# Patient Record
Sex: Female | Born: 1965 | Race: White | Hispanic: No | Marital: Married | Smoking: Current some day smoker
Health system: Southern US, Community
[De-identification: ages and names within clinical notes are randomized; demographics above are authoritative.]

## PROBLEM LIST (undated history)

## (undated) DIAGNOSIS — F3181 Bipolar II disorder: Secondary | ICD-10-CM

## (undated) DIAGNOSIS — F32A Depression, unspecified: Secondary | ICD-10-CM

## (undated) DIAGNOSIS — K635 Polyp of colon: Secondary | ICD-10-CM

## (undated) DIAGNOSIS — I219 Acute myocardial infarction, unspecified: Secondary | ICD-10-CM

## (undated) DIAGNOSIS — F329 Major depressive disorder, single episode, unspecified: Secondary | ICD-10-CM

## (undated) DIAGNOSIS — E785 Hyperlipidemia, unspecified: Secondary | ICD-10-CM

## (undated) DIAGNOSIS — F419 Anxiety disorder, unspecified: Secondary | ICD-10-CM

## (undated) DIAGNOSIS — I10 Essential (primary) hypertension: Secondary | ICD-10-CM

## (undated) HISTORY — PX: OTHER SURGICAL HISTORY: SHX169

## (undated) HISTORY — PX: ABDOMINAL HYSTERECTOMY: SHX81

## (undated) HISTORY — DX: Acute myocardial infarction, unspecified: I21.9

## (undated) HISTORY — DX: Bipolar II disorder: F31.81

## (undated) HISTORY — DX: Anxiety disorder, unspecified: F41.9

## (undated) HISTORY — DX: Essential (primary) hypertension: I10

## (undated) HISTORY — PX: CARDIAC CATHETERIZATION: SHX172

## (undated) HISTORY — DX: Depression, unspecified: F32.A

## (undated) HISTORY — PX: HERNIA REPAIR: SHX51

## (undated) HISTORY — DX: Polyp of colon: K63.5

## (undated) HISTORY — DX: Hyperlipidemia, unspecified: E78.5

## (undated) HISTORY — PX: BREAST BIOPSY: SHX20

## (undated) HISTORY — DX: Major depressive disorder, single episode, unspecified: F32.9

---

## 1998-06-08 ENCOUNTER — Other Ambulatory Visit: Admission: RE | Admit: 1998-06-08 | Discharge: 1998-06-08 | Payer: Self-pay | Admitting: Surgery

## 1999-06-10 ENCOUNTER — Inpatient Hospital Stay (HOSPITAL_COMMUNITY): Admission: AD | Admit: 1999-06-10 | Discharge: 1999-06-10 | Payer: Self-pay | Admitting: Obstetrics and Gynecology

## 1999-10-11 ENCOUNTER — Inpatient Hospital Stay (HOSPITAL_COMMUNITY): Admission: AD | Admit: 1999-10-11 | Discharge: 1999-10-11 | Payer: Self-pay | Admitting: Obstetrics and Gynecology

## 1999-10-13 ENCOUNTER — Inpatient Hospital Stay (HOSPITAL_COMMUNITY): Admission: AD | Admit: 1999-10-13 | Discharge: 1999-10-13 | Payer: Self-pay | Admitting: Obstetrics and Gynecology

## 1999-12-02 ENCOUNTER — Inpatient Hospital Stay (HOSPITAL_COMMUNITY): Admission: AD | Admit: 1999-12-02 | Discharge: 1999-12-02 | Payer: Self-pay | Admitting: Obstetrics and Gynecology

## 2000-01-10 ENCOUNTER — Inpatient Hospital Stay (HOSPITAL_COMMUNITY): Admission: AD | Admit: 2000-01-10 | Discharge: 2000-01-13 | Payer: Self-pay | Admitting: Obstetrics and Gynecology

## 2000-02-13 ENCOUNTER — Other Ambulatory Visit: Admission: RE | Admit: 2000-02-13 | Discharge: 2000-02-13 | Payer: Self-pay | Admitting: Obstetrics and Gynecology

## 2001-04-16 ENCOUNTER — Emergency Department (HOSPITAL_COMMUNITY): Admission: EM | Admit: 2001-04-16 | Discharge: 2001-04-16 | Payer: Self-pay | Admitting: Emergency Medicine

## 2002-03-06 ENCOUNTER — Emergency Department (HOSPITAL_COMMUNITY): Admission: EM | Admit: 2002-03-06 | Discharge: 2002-03-06 | Payer: Self-pay | Admitting: Emergency Medicine

## 2002-03-07 ENCOUNTER — Other Ambulatory Visit (HOSPITAL_COMMUNITY): Admission: RE | Admit: 2002-03-07 | Discharge: 2002-03-14 | Payer: Self-pay | Admitting: *Deleted

## 2002-03-10 ENCOUNTER — Emergency Department (HOSPITAL_COMMUNITY): Admission: EM | Admit: 2002-03-10 | Discharge: 2002-03-10 | Payer: Self-pay | Admitting: Emergency Medicine

## 2002-03-12 ENCOUNTER — Emergency Department (HOSPITAL_COMMUNITY): Admission: EM | Admit: 2002-03-12 | Discharge: 2002-03-12 | Payer: Self-pay | Admitting: Emergency Medicine

## 2002-06-28 ENCOUNTER — Emergency Department (HOSPITAL_COMMUNITY): Admission: EM | Admit: 2002-06-28 | Discharge: 2002-06-29 | Payer: Self-pay | Admitting: Emergency Medicine

## 2002-07-20 ENCOUNTER — Ambulatory Visit (HOSPITAL_BASED_OUTPATIENT_CLINIC_OR_DEPARTMENT_OTHER): Admission: RE | Admit: 2002-07-20 | Discharge: 2002-07-21 | Payer: Self-pay | Admitting: Orthopedic Surgery

## 2003-01-07 ENCOUNTER — Encounter: Payer: Self-pay | Admitting: Emergency Medicine

## 2003-01-07 ENCOUNTER — Emergency Department (HOSPITAL_COMMUNITY): Admission: EM | Admit: 2003-01-07 | Discharge: 2003-01-07 | Payer: Self-pay | Admitting: Emergency Medicine

## 2003-03-27 ENCOUNTER — Emergency Department (HOSPITAL_COMMUNITY): Admission: EM | Admit: 2003-03-27 | Discharge: 2003-03-27 | Payer: Self-pay | Admitting: Emergency Medicine

## 2003-03-29 ENCOUNTER — Other Ambulatory Visit (HOSPITAL_COMMUNITY): Admission: RE | Admit: 2003-03-29 | Discharge: 2003-03-31 | Payer: Self-pay | Admitting: *Deleted

## 2003-04-02 ENCOUNTER — Inpatient Hospital Stay (HOSPITAL_COMMUNITY): Admission: AD | Admit: 2003-04-02 | Discharge: 2003-04-02 | Payer: Self-pay | Admitting: Obstetrics and Gynecology

## 2003-05-04 ENCOUNTER — Observation Stay (HOSPITAL_COMMUNITY): Admission: RE | Admit: 2003-05-04 | Discharge: 2003-05-05 | Payer: Self-pay | Admitting: Obstetrics and Gynecology

## 2003-05-04 ENCOUNTER — Encounter: Payer: Self-pay | Admitting: Anesthesiology

## 2003-05-04 ENCOUNTER — Encounter (INDEPENDENT_AMBULATORY_CARE_PROVIDER_SITE_OTHER): Payer: Self-pay | Admitting: *Deleted

## 2003-05-10 ENCOUNTER — Inpatient Hospital Stay (HOSPITAL_COMMUNITY): Admission: AD | Admit: 2003-05-10 | Discharge: 2003-05-10 | Payer: Self-pay | Admitting: Obstetrics & Gynecology

## 2003-05-27 ENCOUNTER — Emergency Department (HOSPITAL_COMMUNITY): Admission: EM | Admit: 2003-05-27 | Discharge: 2003-05-27 | Payer: Self-pay

## 2003-06-22 ENCOUNTER — Emergency Department (HOSPITAL_COMMUNITY): Admission: EM | Admit: 2003-06-22 | Discharge: 2003-06-23 | Payer: Self-pay | Admitting: Emergency Medicine

## 2003-08-03 ENCOUNTER — Emergency Department (HOSPITAL_COMMUNITY): Admission: EM | Admit: 2003-08-03 | Discharge: 2003-08-03 | Payer: Self-pay | Admitting: Emergency Medicine

## 2003-12-15 ENCOUNTER — Emergency Department (HOSPITAL_COMMUNITY): Admission: EM | Admit: 2003-12-15 | Discharge: 2003-12-15 | Payer: Self-pay | Admitting: Emergency Medicine

## 2004-01-15 ENCOUNTER — Other Ambulatory Visit (HOSPITAL_COMMUNITY): Admission: RE | Admit: 2004-01-15 | Discharge: 2004-01-22 | Payer: Self-pay | Admitting: Psychiatry

## 2004-01-28 ENCOUNTER — Emergency Department (HOSPITAL_COMMUNITY): Admission: EM | Admit: 2004-01-28 | Discharge: 2004-01-28 | Payer: Self-pay | Admitting: Emergency Medicine

## 2004-03-28 ENCOUNTER — Emergency Department (HOSPITAL_COMMUNITY): Admission: EM | Admit: 2004-03-28 | Discharge: 2004-03-28 | Payer: Self-pay | Admitting: Emergency Medicine

## 2004-08-14 ENCOUNTER — Emergency Department (HOSPITAL_COMMUNITY): Admission: EM | Admit: 2004-08-14 | Discharge: 2004-08-14 | Payer: Self-pay | Admitting: *Deleted

## 2004-09-18 ENCOUNTER — Emergency Department (HOSPITAL_COMMUNITY): Admission: EM | Admit: 2004-09-18 | Discharge: 2004-09-18 | Payer: Self-pay | Admitting: Emergency Medicine

## 2005-01-15 ENCOUNTER — Inpatient Hospital Stay (HOSPITAL_COMMUNITY): Admission: EM | Admit: 2005-01-15 | Discharge: 2005-01-17 | Payer: Self-pay | Admitting: Emergency Medicine

## 2005-01-16 ENCOUNTER — Encounter (INDEPENDENT_AMBULATORY_CARE_PROVIDER_SITE_OTHER): Payer: Self-pay | Admitting: *Deleted

## 2005-04-12 ENCOUNTER — Emergency Department (HOSPITAL_COMMUNITY): Admission: EM | Admit: 2005-04-12 | Discharge: 2005-04-12 | Payer: Self-pay | Admitting: Emergency Medicine

## 2005-06-19 ENCOUNTER — Emergency Department (HOSPITAL_COMMUNITY): Admission: EM | Admit: 2005-06-19 | Discharge: 2005-06-19 | Payer: Self-pay | Admitting: Emergency Medicine

## 2005-08-20 ENCOUNTER — Observation Stay (HOSPITAL_COMMUNITY): Admission: EM | Admit: 2005-08-20 | Discharge: 2005-08-21 | Payer: Self-pay | Admitting: Emergency Medicine

## 2005-08-20 ENCOUNTER — Encounter: Payer: Self-pay | Admitting: Cardiology

## 2006-01-24 ENCOUNTER — Emergency Department (HOSPITAL_COMMUNITY): Admission: EM | Admit: 2006-01-24 | Discharge: 2006-01-24 | Payer: Self-pay | Admitting: Emergency Medicine

## 2006-07-19 ENCOUNTER — Emergency Department (HOSPITAL_COMMUNITY): Admission: AD | Admit: 2006-07-19 | Discharge: 2006-07-19 | Payer: Self-pay | Admitting: Family Medicine

## 2006-07-20 ENCOUNTER — Inpatient Hospital Stay (HOSPITAL_COMMUNITY): Admission: EM | Admit: 2006-07-20 | Discharge: 2006-07-23 | Payer: Self-pay | Admitting: Emergency Medicine

## 2006-08-23 ENCOUNTER — Emergency Department (HOSPITAL_COMMUNITY): Admission: EM | Admit: 2006-08-23 | Discharge: 2006-08-23 | Payer: Self-pay | Admitting: Emergency Medicine

## 2006-11-10 ENCOUNTER — Emergency Department (HOSPITAL_COMMUNITY): Admission: EM | Admit: 2006-11-10 | Discharge: 2006-11-10 | Payer: Self-pay | Admitting: Emergency Medicine

## 2006-11-26 ENCOUNTER — Observation Stay (HOSPITAL_COMMUNITY): Admission: EM | Admit: 2006-11-26 | Discharge: 2006-11-27 | Payer: Self-pay | Admitting: Emergency Medicine

## 2006-12-13 ENCOUNTER — Emergency Department (HOSPITAL_COMMUNITY): Admission: EM | Admit: 2006-12-13 | Discharge: 2006-12-13 | Payer: Self-pay | Admitting: Emergency Medicine

## 2007-03-26 ENCOUNTER — Encounter: Admission: RE | Admit: 2007-03-26 | Discharge: 2007-06-24 | Payer: Self-pay | Admitting: Family Medicine

## 2007-04-07 ENCOUNTER — Encounter (INDEPENDENT_AMBULATORY_CARE_PROVIDER_SITE_OTHER): Payer: Self-pay | Admitting: *Deleted

## 2007-04-07 ENCOUNTER — Ambulatory Visit (HOSPITAL_COMMUNITY): Admission: RE | Admit: 2007-04-07 | Discharge: 2007-04-07 | Payer: Self-pay | Admitting: General Surgery

## 2007-04-08 ENCOUNTER — Emergency Department (HOSPITAL_COMMUNITY): Admission: EM | Admit: 2007-04-08 | Discharge: 2007-04-09 | Payer: Self-pay | Admitting: Emergency Medicine

## 2008-05-14 ENCOUNTER — Emergency Department (HOSPITAL_COMMUNITY): Admission: EM | Admit: 2008-05-14 | Discharge: 2008-05-14 | Payer: Self-pay | Admitting: Emergency Medicine

## 2009-07-28 ENCOUNTER — Emergency Department (HOSPITAL_COMMUNITY): Admission: EM | Admit: 2009-07-28 | Discharge: 2009-07-28 | Payer: Self-pay | Admitting: Emergency Medicine

## 2010-01-10 ENCOUNTER — Emergency Department (HOSPITAL_COMMUNITY): Admission: EM | Admit: 2010-01-10 | Discharge: 2010-01-11 | Payer: Self-pay | Admitting: Emergency Medicine

## 2010-01-30 ENCOUNTER — Encounter: Admission: RE | Admit: 2010-01-30 | Discharge: 2010-01-30 | Payer: Self-pay | Admitting: Family Medicine

## 2010-05-27 ENCOUNTER — Emergency Department (HOSPITAL_COMMUNITY): Admission: EM | Admit: 2010-05-27 | Discharge: 2010-05-27 | Payer: Self-pay | Admitting: Emergency Medicine

## 2010-10-31 ENCOUNTER — Emergency Department (HOSPITAL_COMMUNITY): Admission: EM | Admit: 2010-10-31 | Discharge: 2010-10-31 | Payer: Self-pay | Admitting: Emergency Medicine

## 2010-11-24 ENCOUNTER — Emergency Department (HOSPITAL_COMMUNITY): Admission: EM | Admit: 2010-11-24 | Discharge: 2010-11-25 | Payer: Self-pay | Admitting: Emergency Medicine

## 2010-11-25 ENCOUNTER — Encounter (INDEPENDENT_AMBULATORY_CARE_PROVIDER_SITE_OTHER): Payer: Self-pay | Admitting: *Deleted

## 2010-12-29 DIAGNOSIS — K635 Polyp of colon: Secondary | ICD-10-CM

## 2010-12-29 HISTORY — DX: Polyp of colon: K63.5

## 2011-01-03 ENCOUNTER — Emergency Department (HOSPITAL_COMMUNITY)
Admission: EM | Admit: 2011-01-03 | Discharge: 2011-01-04 | Payer: Self-pay | Source: Home / Self Care | Admitting: Emergency Medicine

## 2011-01-03 ENCOUNTER — Encounter: Payer: Self-pay | Admitting: Nurse Practitioner

## 2011-01-06 ENCOUNTER — Encounter (INDEPENDENT_AMBULATORY_CARE_PROVIDER_SITE_OTHER): Payer: Self-pay | Admitting: *Deleted

## 2011-01-06 ENCOUNTER — Emergency Department (HOSPITAL_COMMUNITY)
Admission: EM | Admit: 2011-01-06 | Discharge: 2011-01-06 | Payer: Self-pay | Source: Home / Self Care | Admitting: Emergency Medicine

## 2011-01-07 ENCOUNTER — Telehealth (INDEPENDENT_AMBULATORY_CARE_PROVIDER_SITE_OTHER): Payer: Self-pay | Admitting: *Deleted

## 2011-01-09 ENCOUNTER — Encounter: Payer: Self-pay | Admitting: Gastroenterology

## 2011-01-09 ENCOUNTER — Ambulatory Visit
Admission: RE | Admit: 2011-01-09 | Discharge: 2011-01-09 | Payer: Self-pay | Source: Home / Self Care | Attending: Gastroenterology | Admitting: Gastroenterology

## 2011-01-09 DIAGNOSIS — F411 Generalized anxiety disorder: Secondary | ICD-10-CM | POA: Insufficient documentation

## 2011-01-09 DIAGNOSIS — F3189 Other bipolar disorder: Secondary | ICD-10-CM | POA: Insufficient documentation

## 2011-01-09 DIAGNOSIS — R634 Abnormal weight loss: Secondary | ICD-10-CM | POA: Insufficient documentation

## 2011-01-09 DIAGNOSIS — R1012 Left upper quadrant pain: Secondary | ICD-10-CM | POA: Insufficient documentation

## 2011-01-09 DIAGNOSIS — R198 Other specified symptoms and signs involving the digestive system and abdomen: Secondary | ICD-10-CM | POA: Insufficient documentation

## 2011-01-09 DIAGNOSIS — R110 Nausea: Secondary | ICD-10-CM | POA: Insufficient documentation

## 2011-01-13 LAB — BASIC METABOLIC PANEL
BUN: 4 mg/dL — ABNORMAL LOW (ref 6–23)
CO2: 21 mEq/L (ref 19–32)
Calcium: 9.4 mg/dL (ref 8.4–10.5)
Chloride: 108 mEq/L (ref 96–112)
Creatinine, Ser: 0.67 mg/dL (ref 0.4–1.2)
GFR calc Af Amer: >60 mL/min (ref >60)
GFR calc non Af Amer: >60 mL/min (ref >60)
Glucose, Bld: 96 mg/dL (ref 70–99)
Potassium: 3.8 mEq/L (ref 3.5–5.1)
Sodium: 139 mEq/L (ref 135–145)

## 2011-01-13 LAB — HEPATIC FUNCTION PANEL
ALT: 11 U/L (ref 0–35)
AST: 16 U/L (ref 0–37)
Albumin: 4.2 g/dL (ref 3.5–5.2)
Alkaline Phosphatase: 51 U/L (ref 39–117)
Bilirubin, Direct: 0.1 mg/dL (ref 0.0–0.3)
Indirect Bilirubin: 0.8 mg/dL (ref 0.3–0.9)
Total Bilirubin: 0.9 mg/dL (ref 0.3–1.2)
Total Protein: 7.3 g/dL (ref 6.0–8.3)

## 2011-01-13 LAB — DIFFERENTIAL
Basophils Absolute: 0.1 10*3/uL (ref 0.0–0.1)
Basophils Absolute: 0.1 10*3/uL (ref 0.0–0.1)
Basophils Relative: 1 % (ref 0–1)
Basophils Relative: 1 % (ref 0–1)
Eosinophils Absolute: 0.3 10*3/uL (ref 0.0–0.7)
Eosinophils Absolute: 0.2 10*3/uL (ref 0.0–0.7)
Eosinophils Relative: 3 % (ref 0–5)
Eosinophils Relative: 3 % (ref 0–5)
Lymphocytes Relative: 37 % (ref 12–46)
Lymphocytes Relative: 32 % (ref 12–46)
Lymphs Abs: 3.6 10*3/uL (ref 0.7–4.0)
Lymphs Abs: 2.5 10*3/uL (ref 0.7–4.0)
Monocytes Absolute: 0.7 10*3/uL (ref 0.1–1.0)
Monocytes Absolute: 0.5 10*3/uL (ref 0.1–1.0)
Monocytes Relative: 7 % (ref 3–12)
Monocytes Relative: 6 % (ref 3–12)
Neutro Abs: 5.1 10*3/uL (ref 1.7–7.7)
Neutro Abs: 4.7 10*3/uL (ref 1.7–7.7)
Neutrophils Relative %: 52 % (ref 43–77)
Neutrophils Relative %: 59 % (ref 43–77)

## 2011-01-13 LAB — CBC
HCT: 44.7 % (ref 36.0–46.0)
HCT: 45.8 % (ref 36.0–46.0)
Hemoglobin: 15.1 g/dL — ABNORMAL HIGH (ref 12.0–15.0)
Hemoglobin: 15.7 g/dL — ABNORMAL HIGH (ref 12.0–15.0)
MCH: 29.8 pg (ref 26.0–34.0)
MCH: 30.1 pg (ref 26.0–34.0)
MCHC: 33.8 g/dL (ref 30.0–36.0)
MCHC: 34.3 g/dL (ref 30.0–36.0)
MCV: 88.3 fL (ref 78.0–100.0)
MCV: 87.9 fL (ref 78.0–100.0)
Platelets: 244 10*3/uL (ref 150–400)
Platelets: 238 10*3/uL (ref 150–400)
RBC: 5.06 MIL/uL (ref 3.87–5.11)
RBC: 5.21 MIL/uL — ABNORMAL HIGH (ref 3.87–5.11)
RDW: 12.5 % (ref 11.5–15.5)
RDW: 12.3 % (ref 11.5–15.5)
WBC: 9.7 10*3/uL (ref 4.0–10.5)
WBC: 7.9 10*3/uL (ref 4.0–10.5)

## 2011-01-13 LAB — URINALYSIS, ROUTINE W REFLEX MICROSCOPIC
Bilirubin Urine: NEGATIVE
Hgb urine dipstick: NEGATIVE
Ketones, ur: 15 mg/dL — AB
Nitrite: NEGATIVE
Protein, ur: NEGATIVE mg/dL
Specific Gravity, Urine: 1.016 (ref 1.005–1.030)
Urine Glucose, Fasting: NEGATIVE mg/dL
Urobilinogen, UA: 1 mg/dL (ref 0.0–1.0)
pH: 6.5 (ref 5.0–8.0)

## 2011-01-13 LAB — COMPREHENSIVE METABOLIC PANEL
ALT: 10 U/L (ref 0–35)
AST: 18 U/L (ref 0–37)
Albumin: 3.2 g/dL — ABNORMAL LOW (ref 3.5–5.2)
Alkaline Phosphatase: 36 U/L — ABNORMAL LOW (ref 39–117)
BUN: 7 mg/dL (ref 6–23)
CO2: 22 mEq/L (ref 19–32)
Calcium: 8 mg/dL — ABNORMAL LOW (ref 8.4–10.5)
Chloride: 114 mEq/L — ABNORMAL HIGH (ref 96–112)
Creatinine, Ser: 0.59 mg/dL (ref 0.4–1.2)
GFR calc Af Amer: >60 mL/min (ref >60)
GFR calc non Af Amer: >60 mL/min (ref >60)
Glucose, Bld: 86 mg/dL (ref 70–99)
Potassium: 3.8 mEq/L (ref 3.5–5.1)
Sodium: 141 mEq/L (ref 135–145)
Total Bilirubin: 0.7 mg/dL (ref 0.3–1.2)
Total Protein: 5.4 g/dL — ABNORMAL LOW (ref 6.0–8.3)

## 2011-01-13 LAB — LIPASE, BLOOD: Lipase: 16 U/L (ref 11–59)

## 2011-01-13 LAB — PREGNANCY, URINE: Preg Test, Ur: NEGATIVE

## 2011-01-15 ENCOUNTER — Telehealth: Payer: Self-pay | Admitting: Nurse Practitioner

## 2011-01-22 ENCOUNTER — Ambulatory Visit
Admission: RE | Admit: 2011-01-22 | Discharge: 2011-01-22 | Payer: Self-pay | Source: Home / Self Care | Attending: Gastroenterology | Admitting: Gastroenterology

## 2011-01-22 ENCOUNTER — Encounter: Payer: Self-pay | Admitting: Gastroenterology

## 2011-01-22 ENCOUNTER — Other Ambulatory Visit: Payer: Self-pay | Admitting: Gastroenterology

## 2011-01-22 DIAGNOSIS — K5900 Constipation, unspecified: Secondary | ICD-10-CM

## 2011-01-23 ENCOUNTER — Encounter: Payer: Self-pay | Admitting: Gastroenterology

## 2011-01-28 ENCOUNTER — Encounter: Payer: Self-pay | Admitting: Gastroenterology

## 2011-01-28 ENCOUNTER — Telehealth: Payer: Self-pay | Admitting: Gastroenterology

## 2011-01-30 NOTE — Procedures (Addendum)
Summary: Upper Endoscopy  Patient: Phyllis Garza Note: All result statuses are Final unless otherwise noted.  Tests: (1) Upper Endoscopy (EGD)   EGD Upper Endoscopy       DONE (C)     [facility]     [address]     [place], [place]  [address]          ENDOSCOPY PROCEDURE REPORT          PATIENT:  Phyllis Garza, Phyllis Garza  MR#:  [id number]     BIRTHDATE:  [birth year], 44 yrs. old  GENDER:  female          ENDOSCOPIST:  Barbette Hair. Arlyce Dice, MD     Referred by:  Herb Grays, M.D.          PROCEDURE DATE:  01/22/2011     PROCEDURE:  EGD, diagnostic 43235     ASA CLASS:  Class II     INDICATIONS:  nausea, abdominal pain          MEDICATIONS:   There was residual sedation effect present from     prior procedure., MAC sedation, administered by CRNA Propofol     120mg  IVJ(correction)     TOPICAL ANESTHETIC:          DESCRIPTION OF PROCEDURE:   After the risks benefits and     alternatives of the procedure were thoroughly explained, informed     consent was obtained.  The LB [id number]-H180 [id number] endoscope was     introduced through the mouth and advanced to the third portion of     the duodenum, without limitations.  The instrument was slowly     withdrawn as the mucosa was fully examined.     <<PROCEDUREIMAGES>>          The upper, middle, and distal third of the esophagus were     carefully inspected and no abnormalities were noted. The z-line     was well seen at the GEJ. The endoscope was pushed into the fundus     which was normal including a retroflexed view. The antrum,gastric     body, first and second part of the duodenum were unremarkable (see     image1, image2, image3, image4, image5, image6, image7, and     image8).    Retroflexed views revealed no abnormalities.    The     scope was then withdrawn from the patient and the procedure     completed.          COMPLICATIONS:  None          ENDOSCOPIC IMPRESSION:     1) Normal EGD     RECOMMENDATIONS:     1) Call  office next 2-3 days to schedule an office appointment     for 1 month     2) trial of hyomax for pain          REPEAT EXAM:  No          ______________________________     Barbette Hair. Arlyce Dice, MD          CC:          n.     REVISED:  03/17/2011 11:24 AM     eSIGNED:   Barbette Hair. Kaplan at 03/17/2011 11:24 AM          [name], [id number]  Note: An exclamation mark (!) indicates a result that was not dispersed into the flowsheet. Document Creation Date: 03/17/2011 11:25 AM _______________________________________________________________________  (  1) Order result status: Final Collection or observation date-time: 01/22/2011 10:28 Requested date-time:  Receipt date-time:  Reported date-time:  Referring Physician:   Ordering Physician: Melvia Heaps [phone]) Specimen Source:  Source: Launa Grill Order Number: [id number] Lab site:

## 2011-01-30 NOTE — Miscellaneous (Signed)
  Clinical Lists Changes  Medications: Added new medication of LACTULOSE 10 GM/15ML  SOLN (LACTULOSE) 2 tablespoons 2-4x once daily - Signed Rx of LACTULOSE 10 GM/15ML  SOLN (LACTULOSE) 2 tablespoons 2-4x once daily;  #500 x 5;  Signed;  Entered by: Louis Meckel MD;  Authorized by: Louis Meckel MD;  Method used: Electronically to [facility] [place] [address] [place], [place], [place]  [address], Ph: [phone], Fax: [phone]    Prescriptions: LACTULOSE 10 GM/15ML  SOLN (LACTULOSE) 2 tablespoons 2-4x once daily  #500 x 5   Entered and Authorized by:   Louis Meckel MD   Signed by:   Louis Meckel MD on 01/22/2011   Method used:   Electronically to        [facility] [address] [phone]* (retail)       [address] [place], [place]  [address]       Ph: [phone]       Fax: [phone]   RxID:   [id number]

## 2011-01-30 NOTE — Progress Notes (Signed)
Summary: Triage  Phone Note Call from Patient Call back at Home Phone [phone]   Caller: Patient Call For: Gunnar Fusi Reason for Call: Talk to Nurse Summary of Call: Pt is still not eating, she is trying to drink ensure but when she gets half way thru the bottle her stomach hurts so bad she doubles over to the fetal postion, wants to speak with nurse Initial call taken by: [place] Johnson,  January 15, 2011 10:55 AM  Follow-up for Phone Call        Spoke with patient. She saw Willette Cluster, RNP last week and is scheduled for EGD/Colon next week. Paula instructed her to try to drink Ensure 2 cans/day. She has only been able to drink 3/4' s of one can/day then her stomach hurts. She states that yesterday she had the 3/4 can of Ensure and about 3 bites of mashed potatoes and then her stomach pain was so bad she"doubled over". Pain is above the belly button and to the left side. She feels hungry but cannot eat due to pain and nausea. She states her last BM was "months ago". States she was using enemas three times a day and only had pieces of stool with this. She wants to know if she should just wait until next week procedures to see what is wrong or should she do something else. Please, advise. Follow-up by: Jesse Fall RN,  January 15, 2011 11:34 AM  Additional Follow-up for Phone Call Additional follow up Details #1::        I will not have any additional information until completion of her EGD and colonoscopy. If cannot hold down fluids then obviously needs ER, otherwise will await EGD / Colon Additional Follow-up by: Willette Cluster NP,  January 16, 2011 1:40 PM    Additional Follow-up for Phone Call Additional follow up Details #2::    Spoke to patient and gave her Willette Cluster, RNP recommendations. Patient states she has been to the ER four times and they haven't done anything. She states she is going to go to her primary care because her urine is smelling like ammonia today.  Follow-up  by: Jesse Fall RN,  January 16, 2011 2:29 PM

## 2011-01-30 NOTE — Initial Assessments (Signed)
Summary: Phyllis Garza H&P  Phyllis Garza, Phyllis Garza MRN: [id number] Acct#: [id number] PHYSICIAN DOCUMENTATION Claudia Pollock Jan 09 19:27:33 EST 2012 [facility] [address] [place], [place] [address] PHONE: [phone] MRN: [id number] Account #: [id number] Name: Phyllis Garza, Phyllis Garza Sex: F Age: 45 DOB: 09-Nov-1966 Complaint: Abdominal pain Primary Diagnosis: Abdominal pain Arrival Time: 01/06/2011 15:38 Discharge Time: 01/06/2011 19:25 All Providers: Dr. Earlyne Iba - MD PROVIDER: Dr. Earlyne Iba - MD HPI: The patient is a 45 year old female who presents with a chief complaint of abdominal pain. The history was provided by the patient. The patient was seen at 04:09 PM. This patient has had ongoing abd pain. She has had constipiaption. She sees a gastroenterologist abdominal pain [facility] who has told her to take Miralax 5-6 packets daily. The patient was seen in ED 3 days ago for abd pain and continued constipation. She is still having pain. The abdominal pain started an unknown time ago. The onset was insidious. The Pattern is episodic. The Course is persistent. The abdominal pain is located in the abdomen. The abdominal pain has no radiation. It is characterized as cramping and dull. The patient's pain was 6 out of 10 at its worst. The patient's pain is 6 out of 10 now. The symptoms are described as moderate. The condition is aggravated by nothing. The condition is relieved by nothing. The symptoms have been associated with no other complaints. The patient was treated prior to ED evaluation by no one. The patient has a significant history of anxiety, bipolar disorder, CAD, MRSA, panic attack and constipation. 17:12 01/06/2011 by Earlyne Iba - MD, Dr. Linus Orn: Statement: all systems negative except as marked or noted in the HPI Gastrointestinal: Positive for abdominal pain, constipation and cramps. Negative for nausea, vomiting and diarrhea. 17:13 01/06/2011 by Earlyne Iba - MD,  Dr. [facility]: Documentation: physician reviewed/amended Historian: patient Patient's Current Physicians Patient's Current Physicians (please list PCP first) Phyllis Garza - MD [facility], Maisie Fus (Tom) A Weatherly - Nash Shearer, Anselm Pancoast Croitoru - Card([facility]), Mihai - Cardiology *Non-[facility] PCP, Per pt./family/records Past medical history: coronary artery disease, alcohol abuse, allergies, anxiety, bipolar disorder, 1 Katana, Berthold MRN: [id number] Acct#: [id number] depression, mitral valve regurgitation, MRSA, myocardial infarction, panic attack Family History: alcohol dependence Surgical History: caesarean section, hernia repair, hysterectomy NOTE - orthopedic procedure right ankle , normal cardiac cath 2007 Social History: non-drinker, no drug abuse, current smoker w/i last 12 mos. NOTE - PPCPTammy spears. 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 Home Medications: Documentation: physician reviewed/amended Medications Medication [Medication] Dosage Frequency Last Dose Anucort-HC Rect CeleXA Oral 10mg  twice a day lactulose Oral LaMICtal Oral 100mg  once a day KlonoPIN Oral 0.5mg  prn once a day 16:09 01/06/2011 by Earlyne Iba - MD, Dr. Physical examination: Vital signs and O2 SAT: reviewed Constitutional: well developed, well nourished, well hydrated Head and Face: normocephalic Eyes: normal appearance ENMT: ears, nose and throat normal Neck: supple Spine: entire spine non-tender Cardiovascular: regular rate and rhythm Respiratory: normal Chest: nontender Abdomen: soft, diffuse tenderness Genitourinary: no CVA tenderness Extremities: no abnormalities Neuro: AA&Ox3, Cranial Nerves II-XII intact, motor intact in all  extremities Skin: color normal, no rash, no petechiae 2 Phyllis Garza, Phyllis Garza MRN: [id number] Acct#: [id number] 17:13 01/06/2011 by Earlyne Iba - MD, Dr. Reviewed result: Result Type: Cleda Daub: [id number] Step Type:  XRAY Procedure Name: DG ABD ACUTE W/CHEST Procedure: DG ABD ACUTE W/CHEST Result: Clinical Data: Abdominal pain ACUTE ABDOMEN SERIES (ABDOMEN 2 VIEW /T/ CHEST 1 VIEW) Comparison: 01/03/2011 Findings: Negative for infiltrate or effusion. Negative for pulmonary edema. Scarring in the lingula is unchanged. Negative for bowel obstruction or free intraperitoneal gas. No renal calculi. No significant bony abnormality. IMPRESSION: Negative Note/Interpretation: Acute Abd Series: no acute findings. 16:31 01/06/2011 by Earlyne Iba - MD, Dr. Reviewed result: Result Type: Cleda Daub: [id number] Step Type: LAB Procedure Name: URINE PREGNANCY Procedure: URINE PREGNANCY Procedure Notes: URINE PREGNANCY - THE SENSITIVITY OF THIS METHODOLOGY IS >24 mIU/mL Result: URINE PREGNANCY NEGATIVE Note/Interpretation: neg preg 16:50 01/06/2011 by Earlyne Iba - MD, Dr. Reviewed result: Result Type: FINAL_RESULT 3 Phyllis Garza, Phyllis Garza - MRN: [id number] Acct#: [id number] Orderno: [id number] Step Type: LAB Procedure Name: URINE MACROSCOPIC Procedure: URINE MACROSCOPIC Procedure Notes: LEUKOCYTE ESTERASE - MICROSCOPIC NOT DONE ON URINES WITH NEGATIVE PROTEIN, BLOOD, LEUKOCYTES, NITRITE, OR GLUCOSE <1000 mg/dL. Result: URINE COLOR YELLOW [YELLOW] URINE APPEARANCE CLOUDY [CLEAR] A URINE SPEC GRAVITY 1.016 [1.005-1.030] URINE PH 6.5 [5.0-8.0] URINE GLUCOSE NEGATIVE mg/dL [NEG] URINE HEMOGLOBIN NEGATIVE [NEG] URINE BILIRUBIN NEGATIVE [NEG] URINE KETONES 15 mg/dL [NEG] A URINE TOTAL PROTEIN NEGATIVE mg/dL [NEG] URINE UROBILINOGEN 1.0 mg/dL [4.7-8.2] URINE NITRITE NEGATIVE [NEG] LEUKOCYTE ESTERASE NEGATIVE [NEG] Note/Interpretation: normal 17:12 01/06/2011 by Earlyne Iba - MD, Dr. Reviewed result: Result Type: Cleda Daub: [id number] Step Type: LAB Procedure Name: CBC WITH DIFF Procedure: CBC WITH DIFF Result: WBC COUNT 7.9 K/uL [4.0-10.5] RBC COUNT 5.21 MIL/uL [3.87-5.11] H HEMOGLOBIN 15.7 g/dL [65.7-84.6] H HEMATOCRIT 45.8 % [36.0-46.0] MCV 87.9 fL [78.0-100.0] MCH 30.1 pg [26.0-34.0] MCHC 34.3 g/dL [96.2-95.2] RDW 84.1 % [11.5-15.5] PLATELET COUNT 238 K/uL [150-400] NEUTROPHIL 59 % [43-77] ABS GRANULOCYTE 4.7 K/uL [1.7-7.7] LYMPHOCYTE 32 % [12-46] ABS LYMPH 2.5 K/uL [0.7-4.0] MONOCYTE 6 % [3-12] ABS MONOCYTE 0.5 K/uL [0.1-1.0] EOSINOPHIL 3 % [0-5] ABS EOS 0.2 K/uL [0.0-0.7] BASOPHIL 1 % [0-1] ABS BASO 0.1 K/uL [0.0-0.1] 4 Phyllis Garza, Phyllis Garza MRN: [id number] Acct#: [id number] Note/Interpretation: normal 17:53 01/06/2011 by Earlyne Iba - MD, Dr. Reviewed result: Result Type: Cleda Daub: [id number] Step Type: LAB Procedure Name: BASIC METABOLIC PANEL Procedure: BASIC METABOLIC PANEL Procedure Notes: GFR, Est Afr Am - The eGFR has been calculated using the MDRD equation. This calculation has not been validated in all clinical situations. eGFR's persistently <60 mL/min signify possible Chronic Kidney Disease. Result: SODIUM 139 mEq/L [135-145] POTASSIUM 3.8 mEq/L [3.5-5.1] CHLORIDE 108 mEq/L [96-112] CARBON DIOXIDE 21 mEq/L [19-32] GLUCOSE 96 mg/dL [34-74] BUN 4 mg/dL [2-59] L CREATININE 5.63 mg/dL [8.7-5.6] CALCIUM 9.4 mg/dL [4.3-32.9] GFR, Est Non Af Am >60 mL/min [>60] GFR, Est Afr Am >60 mL/min [>60] 19:07 01/06/2011 by Earlyne Iba - MD, Dr. Reviewed result: Result Type: Cleda Daub: [id number] Step Type: LAB Procedure Name: HEPATIC FUNCTION PANEL Procedure: HEPATIC FUNCTION PANEL Result: TOTAL PROTEIN 7.3 g/dL [0.6-3.0] ALBUMIN 4.2 g/dL [1.6-0.1] AST/SGOT 16 U/L [0-37] ALT/SGPT 11 U/L [0-35] ALKALINE PHOSPHATASE 51 U/L [39-117] BILIRUBIN, TOTAL 0.9 mg/dL [0.9-3.2] BILIRUBIN,  DIRECT 0.1 mg/dL [3.5-5.7] BILIRUBIN, INDIRECT 0.8 mg/dL [3.2-2.0] 25:42 70/62/3762 by Earlyne Iba - MD, Dr. Bertram Millard Garza, Phyllis Garza - MRN: [id number] Acct#: [id number] ED Course: Comments: 1904: I spoke to the patient. I explained the nl labs and XRays. She will f/u w/ Her GI doctor at [facility] 19:05 01/06/2011 by Earlyne Iba - MD, Dr. MDM: Comments: 1905: Abdominal pain which has been ongoing. I explained that I thought that she needed to follow  up with her GI doctor for further problems. 19:07 01/06/2011 by Earlyne Iba - MD, Dr. Patient disposition: Patient disposition: Disch - Home Primary Diagnosis: abdominal pain Counseling: advised of diagnosis, advised of treatment plan, advised of xray and lab findings, advised of need for close follow-up 19:07 01/06/2011 by Earlyne Iba - MD, Dr. Medication disposition: Medications Medication [Medication] Dosage Frequency Last Dose Medication disposition PCP contact Anucort-HC Rect continue CeleXA Oral 10mg  twice a day continue lactulose Oral continue LaMICtal Oral 100mg  once a day continue KlonoPIN Oral 0.5mg  prn once a day continue 19:07 01/06/2011 by Earlyne Iba - MD, Dr. Discharge: Discharge Instructions: abdominal pain - f/u 24 hours Append a Note to Discharge Instructions: Call the [facility] GI doctor and arrange a follow up appointment. 19:07 01/06/2011 by Earlyne Iba - MD, Dr. Chart electronically signed by Responsible Physician 19:08 01/06/2011 by Earlyne Iba - MD, Dr. Ermalinda Memos

## 2011-01-30 NOTE — Letter (Signed)
Summary: [facility] Instructions  [facility]  [address] [place], [place] [address]   Phone: [phone]  Fax: [phone]       Phyllis Garza    [birth year]    MRN: [id number]        Procedure Day /Date:01-22-2011     Arrival Time:9:00 AM      Procedure Time: 10:00 AM     Location of Procedure:                    X      Endoscopy Center (4th Floor) _ PREPARATION FOR COLONOSCOPY WITH MOVIPREP   Starting 5 days prior to your procedure 01-17-2011 do not eat nuts, seeds, popcorn, corn, beans, peas,  salads, or any raw vegetables.  Do not take any fiber supplements (e.g. Metamucil, Citrucel, and Benefiber).  THE DAY BEFORE YOUR PROCEDURE         DATE: 01-21-2011   DAY: Tuesday  1.  Drink clear liquids the entire day-NO SOLID FOOD  2.  Do not drink anything colored red or purple.  Avoid juices with pulp.  No orange juice.  3.  Drink at least 64 oz. (8 glasses) of fluid/clear liquids during the day to prevent dehydration and help the prep work efficiently.  CLEAR LIQUIDS INCLUDE: Water Jello Ice Popsicles Tea (sugar ok, no milk/cream) Powdered fruit flavored drinks Coffee (sugar ok, no milk/cream) Gatorade Juice: apple, white grape, white cranberry  Lemonade Clear bullion, consomm, broth Carbonated beverages (any kind) Strained chicken noodle soup Hard Candy                             4.  In the morning, mix first dose of MoviPrep solution:    Empty 1 Pouch A and 1 Pouch B into the disposable container    Add lukewarm drinking water to the top line of the container. Mix to dissolve    Refrigerate (mixed solution should be used within 24 hrs)  5.  Begin drinking the prep at 5:00 p.m. The MoviPrep container is divided by 4 marks.   Every 15 minutes drink the solution down to the next mark (approximately 8 oz) until the full liter is complete.   6.  Follow completed prep with 16 oz of clear liquid of your choice (Nothing red or purple).   Continue to drink clear liquids until bedtime.  7.  Before going to bed, mix second dose of MoviPrep solution:    Empty 1 Pouch A and 1 Pouch B into the disposable container    Add lukewarm drinking water to the top line of the container. Mix to dissolve    Refrigerate  THE DAY OF YOUR PROCEDURE      DATE: 01-22-2011  DAY: Wednesday  Beginning at 5:00 AM  (5 hours before procedure):         1. Every 15 minutes, drink the solution down to the next mark (approx 8 oz) until the full liter is complete.  2. Follow completed prep with 16 oz. of clear liquid of your choice.    3. You may drink clear liquids until 8:00 AM  (2 HOURS BEFORE PROCEDURE).    MEDICATION INSTRUCTIONS  Unless otherwise instructed, you should take regular prescription medications with a small sip of water   as early as possible the morning of your procedure.        OTHER INSTRUCTIONS  You will need a responsible  adult at least 44 years of age to accompany you and drive you home.   This person must remain in the waiting room during your procedure.  Wear loose fitting clothing that is easily removed.  Leave jewelry and other valuables at home.  However, you may wish to bring a book to read or  an iPod/MP3 player to listen to music as you wait for your procedure to start.  Remove all body piercing jewelry and leave at home.  Total time from sign-in until discharge is approximately 2-3 hours.  You should go home directly after your procedure and rest.  You can resume normal activities the  day after your procedure.  The day of your procedure you should not:   Drive   Make legal decisions   Operate machinery   Drink alcohol   Return to work  You will receive specific instructions about eating, activities and medications before you leave.    The above instructions have been reviewed and explained to me by   _______________________    I fully understand and can verbalize these instructions  _____________________________ Date _________  Appended Document: Moviprep Instructions Changed the instructions to let the pt know to have no solid food Mon 1-23 and Tues 1-24.

## 2011-01-30 NOTE — Assessment & Plan Note (Signed)
Summary: ABD PAIN & WEIGHT LOSS/YF   History of Present Illness Visit Type: Initial Consult Primary GI MD: Melvia Heaps MD [facility] Primary Provider: Herb Grays, MD Requesting Provider: Herb Grays, MD Chief Complaint: abdominal pain and weight loss History of Present Illness:   Patient is a 45 year old female referred here for evaluation of constipation, abdominal pain and weight loss. Patient's problems began in October 2011. I have reviewed numerous ER records and note from PCP. Prior to October she did have alternating bowel habits which were often associated with psych medication changes. No psych med changes since October to explain constipation. She tried OTC laxatives with no improvement.  Patient has a history of BipolarType II and is followed by psychiatry. Psychiatrist referred her to Dr. Gennie Alma (GI) at [facility] a few weeks ago. Patient was umimpressed with her visit there and doesn't plan to go back. No tests were ordered. She was then referred to Dr. Nedra Hai at [facility]. Her prescribed Golytely followed by Miralax TID. She drank most of the prep which caused vomiting but no significant BM.   There has been some minor rectal bleeding associated with her constipation. In late November she had a CTscan which was normal except for an underdistended transverse colon. Patient has had two recent ER visits for her symptoms. On 01/03/11 an acute abdominal series was normal. CMET, CBC, lipase and TSH normal. On 01/06/11 another abdominal series, CBC and CMET were normal.   In addition to constipation patient complains of nausea and postprandial LUQ abdominal pain. She has lost 19 pounds over the last several weeks.     GI Review of Systems    Reports abdominal pain, loss of appetite, nausea, and  weight loss.     Location of  Abdominal pain: upper abdomen. Weight loss of 19 pounds over 1 mo..   Denies acid reflux, belching, bloating, chest pain, dysphagia with liquids, dysphagia with solids, heartburn,  vomiting, vomiting blood, and  weight gain.      Reports change in bowel habits, constipation, diarrhea, hemorrhoids, irritable bowel syndrome, rectal bleeding, and  rectal pain.     Denies anal fissure, black tarry stools, diverticulosis, fecal incontinence, heme positive stool, jaundice, light color stool, and  liver problems. Preventive Screening-Counseling & Management  Alcohol-Tobacco     Smoking Status: current      Drug Use:  no.      Current Medications (verified): 1)  Citalopram Hydrobromide 20 Mg Tabs (Citalopram Hydrobromide) .... Take 1/2 Tab Twice Daily 2)  Clonazepam 0.5 Mg Tabs (Clonazepam) .... Take 1/2 To 1 Twice Daily As Needed For Anxiety 3)  Lamictal Odt 25 Mg Tbdp (Lamotrigine) .... As Directed 4)  Zofran 4 Mg Tabs (Ondansetron Hcl) .... Take As Needed Nausea  Allergies (verified): 1)  ! Codeine Sulfate (Codeine Sulfate) 2)  ! Penicillin V Potassium (Penicillin V Potassium) 3)  ! Percocet (Oxycodone-Acetaminophen) 4)  ! Erythromycin Base (Erythromycin Base)  Past History:  Past Medical History: Reviewed history from 01/08/2011 and no changes required. Anxiety Depression Biploar  Past Surgical History: 2 C-Sections Hysterectomy 2010 Tonsils-Uvuloplasty diviated septum Lt Ankle-pin plate and 6 screws Hernia Surgery  Family History: Reviewed history from 01/08/2011 and no changes required. Mother- died of MI at 8 Father- died of MI at 77 Sister-breast cancer 79 Heart disease Stroke Asthma Migraine Family History of Colon Polyps:2 brothers No FH of Colon Cancer:  Social History: Reviewed history and no changes required. Married 2 boys Patient currently smokes. 1/2 PPD Alcohol Use -  no Illicit Drug Use - no UnemployedSmoking Status:  current Drug Use:  no  Review of Systems       The patient complains of allergy/sinus, anxiety-new, back pain, depression-new, fatigue, night sweats, and sleeping problems.  The patient denies anemia,  arthritis/joint pain, blood in urine, breast changes/lumps, change in vision, confusion, cough, coughing up blood, fainting, fever, headaches-new, hearing problems, heart murmur, heart rhythm changes, itching, menstrual pain, muscle pains/cramps, nosebleeds, pregnancy symptoms, shortness of breath, skin rash, sore throat, swelling of feet/legs, swollen lymph glands, thirst - excessive , urination - excessive , urination changes/pain, urine leakage, vision changes, and voice change.    Vital Signs:  Patient profile:   45 year old female Height:      65.5 inches Weight:      140.8 pounds BMI:     23.16 Pulse rate:   104 / minute Pulse rhythm:   regular BP sitting:   102 / 68  (left arm)  Vitals Entered By: Milford Cage NCMA (January 09, 2011 9:05 AM)  Physical Exam  General:  Well developed, well nourished, no acute distress. Head:  Normocephalic and atraumatic. Eyes:  Conjunctiva pink, no icterus.  Neck:  no obvious masses  Lungs:  Clear throughout to auscultation. Abdomen:  Abdomen soft, nontender, nondistended. No obvious masses or hepatomegaly.Normal bowel sounds.  Rectal:  Inflamed, protruding hemorrhoids. No masses or stool in vault. Gloved finger heme negative. Msk:  Symmetrical with no gross deformities. Normal posture. Extremities:  No clubbing, cyanosis, edema or deformities noted. Neurologic:  Alert and  oriented x4;  grossly normal neurologically. Skin:  Tattoo across lower back Psych:  Pleasant, alert and cooperative. Normal mood and affect.   Impression & Recommendations:  Problem # 1:  CHANGE IN BOWELS (ZOX-096.04) Assessment New Two-three month history of constipation refractory to Miralax, enemas, etc...CTscans, abdominal series, labs all normal. Patient has seen a gastroenterologist at [facility] and another in [facility] within last two months but no endoscopic workup has been done.. She gives a history of a 19 pound weight loss. For further evaluation the patient  will be scheduled for a colonoscopy with biopsies/polypectomy (if indicated).  The risks and benefits of the procedure, as well as alternatives were discussed with the patient and she agrees to proceed.  Problem # 2:  NAUSEA (ICD-787.02) Assessment: New Associated with postprandial LUQ pain and weight loss. For further evaluation the patient will be scheduled for an EGD with biopsies/ esophageal dilation ( if indicated).  The risks and benefits of the procedure, as well as alternatives were discussed with the patient and she agrees to proceed. EGD will be done at time of colonoscopy.  Orders: EGD SAV (EGD SAV)  Problem # 3:  WEIGHT LOSS-ABNORMAL (ICD-783.21) Assessment: Comment Only  Problem # 4:  ABDOMINAL PAIN-LUQ (ICD-789.02) Assessment: New See #1. Orders: EGD SAV (EGD SAV)  Problem # 5:  BIPOLAR II DISORDER (ICD-296.89) Assessment: Comment Only  Problem # 6:  ANXIETY (ICD-300.00) Assessment: Comment Only She will need Propofol for procedures.  Patient Instructions: 1)  We scheduled the Endoscopy/ Colonoscopy  w/ Propofol with Dr. Arlyce Dice on 01-22-2011 at 10 AM.   2)  Directions and brochure provided. 3)  [facility] Patient Information Guide given to patient. 4)  Copy sent to : Herb Grays Prescriptions: MOVIPREP 100 GM  SOLR (PEG-KCL-NACL-NASULF-NA ASC-C) As per prep instructions.  #1 x 0   Entered by:   Lowry Ram NCMA   Authorized by:   Willette Cluster NP  Signed by:   Lowry Ram NCMA on 01/09/2011   Method used:   Electronically to        [facility] [phone] (retail)       [address] [place], [place]  [address]       Ph: [phone]       Fax: [phone]   RxID:   [id number]

## 2011-01-30 NOTE — Letter (Signed)
Summary: Herb Grays MD  Herb Grays MD   Imported By: Lester [place] 01/20/2011 08:00:23  _____________________________________________________________________  External Attachment:    Type:   Image     Comment:   External Document

## 2011-01-30 NOTE — Letter (Signed)
Summary: Appt Reminder 2  [facility]  [address] [place], [place] [address]   Phone: [phone]  Fax: [phone]        January 23, 2011 MRN: [id number]    [facility] [address] [place], [place]  [address]    Dear Ms. Phyllis Garza,   You have a return appointment with Dr. Arlyce Dice on 02/28/11 at 9am.  Please remember to bring a complete list of the medicines you are taking, your insurance card and your co-pay.  If you have to cancel or reschedule this appointment, please call before 5:00 pm the evening before to avoid a cancellation fee.  If you have any questions or concerns, please call [phone].    Sincerely,    Selinda Michaels RN  Appended Document: Appt Reminder 2 Letter is mailed to the patient's home address

## 2011-01-30 NOTE — Miscellaneous (Signed)
  Clinical Lists Changes  Medications: Added new medication of HYOMAX-SL 0.125 MG SUBL (HYOSCYAMINE SULFATE) take 2 tabs s.l. q4h prn - Signed Rx of HYOMAX-SL 0.125 MG SUBL (HYOSCYAMINE SULFATE) take 2 tabs s.l. q4h prn;  #20 x 1;  Signed;  Entered by: Louis Meckel MD;  Authorized by: Louis Meckel MD;  Method used: Electronically to [facility] [address] [phone]*, [address] [place], [place], [place]  [address], Ph: [phone], Fax: [phone]    Prescriptions: HYOMAX-SL 0.125 MG SUBL (HYOSCYAMINE SULFATE) take 2 tabs s.l. q4h prn  #20 x 1   Entered and Authorized by:   Louis Meckel MD   Signed by:   Louis Meckel MD on 01/22/2011   Method used:   Electronically to        [facility] [phone]* (retail)       [address] [place], [place]  [address]       Ph: [phone]       Fax: [phone]   RxID:   [id number]

## 2011-01-30 NOTE — Procedures (Addendum)
Summary: Colonoscopy  Patient: Phyllis Garza Note: All result statuses are Final unless otherwise noted.  Tests: (1) Colonoscopy (COL)   COL Colonoscopy           DONE     [facility]     [address]     [place], [place]  [address]           COLONOSCOPY PROCEDURE REPORT           PATIENT:  Phyllis Garza, Phyllis Garza  MR#:  [id number]     BIRTHDATE:  20-Sep-1966, 44 yrs. old  GENDER:  female           ENDOSCOPIST:  Barbette Hair. Arlyce Dice, MD     Referred by:  Phyllis Garza, M.D.           PROCEDURE DATE:  01/22/2011     PROCEDURE:  Colonoscopy with snare polypectomy     ASA CLASS:  Class II     INDICATIONS:  1) change in bowel habits  2) constipation           MEDICATIONS:   MAC sedation, administered by CRNA Propofol 180mg      IV, glycopyrrolate (Robinal) 0.2 mg IV, 0.6cc simethancone 0.6 cc     PO, exactaine spray           DESCRIPTION OF PROCEDURE:   After the risks benefits and     alternatives of the procedure were thoroughly explained, informed     consent was obtained.  Digital rectal exam was performed and     revealed no abnormalities.   The LB 180AL [id number] endoscope was     introduced through the anus and advanced to the cecum, which was     identified by both the appendix and ileocecal valve, Several areas     of colon with retained, solid stool  The quality of the prep was     Moviprep fair.  The instrument was then slowly withdrawn as the     colon was fully examined.     <<PROCEDUREIMAGES>>           FINDINGS:  A sessile polyp was found in the sigmoid colon. It was     1 cm in size. It was found 20 cm from the point of entry. Polyp     was snared, then cauterized with monopolar cautery. Retrieval was     successful (see image13 and image14). snare polyp  A sessile polyp     was found in the sigmoid colon. It was 5 mm in size. It was found     15 cm from the point of entry. Polyp was snared without cautery.     Retrieval was successful. snare polyp  This was otherwise  a normal     examination of the colon (see image1, image2, image3, image5,     image6, image9, image12, and image16).   Retroflexed views in the     rectum revealed no abnormalities.    The time to cecum =  9.0 3.50     minutes. The scope was then withdrawn (time =  min) from the     patient and the procedure completed.           COMPLICATIONS:  None           ENDOSCOPIC IMPRESSION:     1) 1 cm sessile polyp in the sigmoid colon     2) 5 mm sessile polyp in the sigmoid colon  3) Otherwise normal examination     RECOMMENDATIONS:     1) If the polyp(s) removed today are proven to be adenomatous     (pre-cancerous) polyps, you will need a repeat colonoscopy in 5     years. Otherwise you should continue to follow colorectal cancer     screening guidelines for "routine risk" patients with colonoscopy     in 10 years.     2) call office next 1-3 days to schedule followup visit in 4     weeks     3) begin lactulose 30cc 2-4 times a day           REPEAT EXAM:   You will receive a letter from Dr. Arlyce Dice in 1-2     weeks, after reviewing the final pathology, with followup     recommendations.           ______________________________     Barbette Hair Arlyce Dice, MD           CC:           n.     eSIGNED:   Barbette Hair. Kalila Adkison at 01/22/2011 10:32 AM           Page 2 of 3   [name], [phone]  Note: An exclamation mark (!) indicates a result that was not dispersed into the flowsheet. Document Creation Date: 01/22/2011 10:32 AM _______________________________________________________________________  (1) Order result status: Final Collection or observation date-time: 01/22/2011 10:19 Requested date-time:  Receipt date-time:  Reported date-time:  Referring Physician:   Ordering Physician: Melvia Heaps [phone]) Specimen Source:  Source: Phyllis Garza Order Number: [id number] Lab site:   Appended Document: Colonoscopy     Procedures Next Due Date:    Colonoscopy: 12/2020

## 2011-01-30 NOTE — Progress Notes (Signed)
Summary: Dr. Collins Scotland  Phone Note Call from Patient Call back at [phone]   Caller: Patient Call For: Dr. Christella Hartigan Reason for Call: Talk to Nurse Summary of Call: Dr. Herb Grays is calling requesting to speak with you about this patient, she has never been seen here before, you can reach Dr. Collins Scotland at [phone] Initial call taken by: [place] Johnson,  January 07, 2011 2:09 PM  Follow-up for Phone Call        no BM in 2 months. has been to [facility] ER.  she has chronic abd pains, has lost 15 pounds.    she needs PA apt this week, or MD Of the DAy or first available MD.  Dr. Collins Scotland says she has not seen GI. Follow-up by: Rachael Fee MD,  January 07, 2011 2:24 PM  Additional Follow-up for Phone Call Additional follow up Details #1::        Called Dr Alda Berthold office & spoke to [place]. Appt with Willette Cluster for thurs 01-10-12 atn 9am. She will call patient with appointment date & time. Adviced to fax [place] any notes. Additional Follow-up by: Leanor Kail [facility],  January 07, 2011 3:52 PM

## 2011-02-05 NOTE — Letter (Signed)
Summary: Patient Notice-Hyperplastic Polyps  [facility]  [address] [place], [place] [address]   Phone: [phone]  Fax: [phone]        January 28, 2011 MRN: [id number]    [facility] [address] [place], [place]  [address]    Dear Ms. Koogler,  I am pleased to inform you that the colon polyp(s) removed during your recent colonoscopy was (were) found to be hyperplastic.  These types of polyps are NOT pre-cancerous.  It is therefore my recommendation that you have a repeat colonoscopy examination in 10_ years for routine colorectal cancer screening.  Should you develop new or worsening symptoms of abdominal pain, bowel habit changes or bleeding from the rectum or bowels, please schedule an evaluation with either your primary care physician or with me.  Additional information/recommendations:  __No further action with gastroenterology is needed at this time.      Please follow-up with your primary care physician for your other      healthcare needs. __Please call [phone] to schedule a return visit to review      your situation.  __Please keep your follow-up visit as already scheduled.  _x_Continue treatment plan as outlined the day of your exam.  Please call us if you are having persistent problems or have questions about your condition that have not been fully answered at this time.  Sincerely,  Louis Meckel MD This letter has been electronically signed by your physician.  Appended Document: Patient Notice-Hyperplastic Polyps letter mailed

## 2011-02-05 NOTE — Progress Notes (Signed)
Summary: Triage  Phone Note Call from Patient Call back at Home Phone [phone]   Caller: Patient Call For: Dr. Arlyce Dice Reason for Call: Talk to Nurse Summary of Call: Had her Endo and still having alot of problems with bloating, abd pain, nauseated Initial call taken by: Karna Christmas,  January 28, 2011 11:53 AM  Follow-up for Phone Call        Patient states that she had an endo/colon last week. She was placed on Lactulose following the procedure. States that she had a very small bowel movement on 01/23/11 and has not had one since. She is taking the lactulose but it is not making her go to the bathroom. She is nauseated and not able to eat. States it takes her all day to drink 2 cans of ensure. Patient states she is very bloated. Dr. Arlyce Dice please advise. Follow-up by: Selinda Michaels RN,  January 28, 2011 1:09 PM  Additional Follow-up for Phone Call Additional follow up Details #1::        if she hasn't tried [place] begin bid Additional Follow-up by: Louis Meckel MD,  January 29, 2011 9:56 AM    Additional Follow-up for Phone Call Additional follow up Details #2::    Pt. has not tried Amitiza so rx. sent to pharmacy.Had hysterectomy 9 yrs.ago.Is going to increase her diet from just liquids that other dr. had  ordered. Follow-up by: Teryl Lucy RN,  January 29, 2011 11:12 AM  New/Updated Medications: AMITIZA 24 MCG CAPS (LUBIPROSTONE) take 1 p.o. b.i.d. Prescriptions: AMITIZA 24 MCG CAPS (LUBIPROSTONE) take 1 p.o. b.i.d.  #60 x 11   Entered by:   Teryl Lucy RN   Authorized by:   Louis Meckel MD   Signed by:   Teryl Lucy RN on 01/29/2011   Method used:   Electronically to        [facility] [phone]* (retail)       [address] [place], [place]  [address]       Ph: [phone]       Fax: [phone]   RxID:   [id number]

## 2011-02-25 ENCOUNTER — Other Ambulatory Visit: Payer: Self-pay | Admitting: Family Medicine

## 2011-02-25 DIAGNOSIS — Z1231 Encounter for screening mammogram for malignant neoplasm of breast: Secondary | ICD-10-CM

## 2011-02-28 ENCOUNTER — Ambulatory Visit (INDEPENDENT_AMBULATORY_CARE_PROVIDER_SITE_OTHER): Payer: 59 | Admitting: Gastroenterology

## 2011-02-28 ENCOUNTER — Encounter: Payer: Self-pay | Admitting: Gastroenterology

## 2011-02-28 DIAGNOSIS — K5900 Constipation, unspecified: Secondary | ICD-10-CM

## 2011-03-05 ENCOUNTER — Ambulatory Visit: Payer: 59

## 2011-03-06 NOTE — Assessment & Plan Note (Signed)
Summary: 4 week follow up   History of Present Illness Visit Type: Follow-up Visit Primary GI MD: Melvia Heaps MD [facility] Primary Provider: Herb Grays, MD Requesting Provider: na Chief Complaint: Follow up from having colon/Endo procedure, Pt symptoms are better now History of Present Illness:    Phyllis Garza has returned for followup regarding her nausea and constipation. She underwent upper and lower endoscopy. The former was normal. A hyperplastic polyp was moved at the latter. On a regimen of lactulose she is moving her bowels twice a week. She is satisfied with that result. Nausea has subsided. She did not tolerate amitiza.   GI Review of Systems      Denies abdominal pain, acid reflux, belching, bloating, chest pain, dysphagia with liquids, dysphagia with solids, heartburn, loss of appetite, nausea, vomiting, vomiting blood, weight loss, and  weight gain.        Denies anal fissure, black tarry stools, change in bowel habit, constipation, diarrhea, diverticulosis, fecal incontinence, heme positive stool, hemorrhoids, irritable bowel syndrome, jaundice, light color stool, liver problems, rectal bleeding, and  rectal pain.    Current Medications (verified): 1)  Citalopram Hydrobromide 20 Mg Tabs (Citalopram Hydrobromide) .... Take 1/2 Tab Twice Daily 2)  Clonazepam 0.5 Mg Tabs (Clonazepam) .... Take 1/2 To 1 Twice Daily As Needed For Anxiety 3)  Lamictal Odt 25 Mg Tbdp (Lamotrigine) .... As Directed 4)  Zofran 4 Mg Tabs (Ondansetron Hcl) .... Take As Needed Nausea 5)  Lactulose 10 Gm/66ml  Soln (Lactulose) .... 2 Tablespoons 2-4x Once Daily  Allergies (verified): 1)  ! Codeine Sulfate (Codeine Sulfate) 2)  ! Penicillin V Potassium (Penicillin V Potassium) 3)  ! Percocet (Oxycodone-Acetaminophen) 4)  ! Erythromycin Base (Erythromycin Base)  Past History:  Family History: Last updated: February 05, 2011 Mother- died of MI at 53 Father- died of MI at 59 Sister-breast cancer 85 Heart  disease Stroke Asthma Migraine Family History of Colon Polyps:2 brothers No FH of Colon Cancer:  Social History: Last updated: February 05, 2011 Married 2 boys Patient currently smokes. 1/2 PPD Alcohol Use - no Illicit Drug Use - no Unemployed  Past Medical History: Reviewed history from 01/08/2011 and no changes required. Anxiety Depression Biploar  Past Surgical History: Reviewed history from Feb 05, 2011 and no changes required. 2 C-Sections Hysterectomy 2010 Tonsils-Uvuloplasty diviated septum Lt Ankle-pin plate and 6 screws Hernia Surgery  Family History: Reviewed history from Feb 05, 2011 and no changes required. Mother- died of MI at 30 Father- died of MI at 68 Sister-breast cancer 54 Heart disease Stroke Asthma Migraine Family History of Colon Polyps:2 brothers No FH of Colon Cancer:  Social History: Reviewed history from 02-05-2011 and no changes required. Married 2 boys Patient currently smokes. 1/2 PPD Alcohol Use - no Illicit Drug Use - no Unemployed  Review of Systems  The patient denies allergy/sinus, anemia, anxiety-new, arthritis/joint pain, back pain, blood in urine, breast changes/lumps, change in vision, confusion, cough, coughing up blood, depression-new, fainting, fatigue, fever, headaches-new, hearing problems, heart murmur, heart rhythm changes, itching, menstrual pain, muscle pains/cramps, night sweats, nosebleeds, pregnancy symptoms, shortness of breath, skin rash, sleeping problems, sore throat, swelling of feet/legs, swollen lymph glands, thirst - excessive , urination - excessive , urination changes/pain, urine leakage, vision changes, and voice change.    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 She's responded well to lactulose.  She will continue with the same.  Problem # 2:  NAUSEA (ICD-787.02) Assessment: Improved

## 2011-03-07 ENCOUNTER — Telehealth: Payer: Self-pay | Admitting: Gastroenterology

## 2011-03-10 ENCOUNTER — Encounter: Payer: Self-pay | Admitting: Physician Assistant

## 2011-03-10 ENCOUNTER — Ambulatory Visit (INDEPENDENT_AMBULATORY_CARE_PROVIDER_SITE_OTHER): Payer: 59 | Admitting: Physician Assistant

## 2011-03-10 ENCOUNTER — Other Ambulatory Visit: Payer: Self-pay | Admitting: Gastroenterology

## 2011-03-10 ENCOUNTER — Ambulatory Visit (INDEPENDENT_AMBULATORY_CARE_PROVIDER_SITE_OTHER)
Admission: RE | Admit: 2011-03-10 | Discharge: 2011-03-10 | Disposition: A | Discharge status: Home / Self Care | Payer: 59 | Source: Ambulatory Visit | Attending: Gastroenterology | Admitting: Gastroenterology

## 2011-03-10 DIAGNOSIS — K5900 Constipation, unspecified: Secondary | ICD-10-CM

## 2011-03-10 DIAGNOSIS — Z860100 Personal history of colon polyps, unspecified: Secondary | ICD-10-CM | POA: Insufficient documentation

## 2011-03-10 DIAGNOSIS — R110 Nausea: Secondary | ICD-10-CM

## 2011-03-10 DIAGNOSIS — F329 Major depressive disorder, single episode, unspecified: Secondary | ICD-10-CM | POA: Insufficient documentation

## 2011-03-10 DIAGNOSIS — R1012 Left upper quadrant pain: Secondary | ICD-10-CM

## 2011-03-10 DIAGNOSIS — R1084 Generalized abdominal pain: Secondary | ICD-10-CM

## 2011-03-10 DIAGNOSIS — F3289 Other specified depressive episodes: Secondary | ICD-10-CM | POA: Insufficient documentation

## 2011-03-11 LAB — URINALYSIS, ROUTINE W REFLEX MICROSCOPIC
Glucose, UA: NEGATIVE mg/dL
pH: 8 (ref 5.0–8.0)

## 2011-03-11 LAB — DIFFERENTIAL
Basophils Absolute: 0.1 10*3/uL (ref 0.0–0.1)
Eosinophils Absolute: 0.2 10*3/uL (ref 0.0–0.7)
Eosinophils Relative: 3 % (ref 0–5)
Lymphocytes Relative: 32 % (ref 12–46)
Lymphs Abs: 2.3 10*3/uL (ref 0.7–4.0)
Monocytes Absolute: 0.4 10*3/uL (ref 0.1–1.0)
Monocytes Relative: 6 % (ref 3–12)
Neutro Abs: 4.1 10*3/uL (ref 1.7–7.7)

## 2011-03-11 LAB — COMPREHENSIVE METABOLIC PANEL
ALT: 11 U/L (ref 0–35)
Alkaline Phosphatase: 56 U/L (ref 39–117)
BUN: 5 mg/dL — ABNORMAL LOW (ref 6–23)
Calcium: 9.1 mg/dL (ref 8.4–10.5)
Creatinine, Ser: 0.58 mg/dL (ref 0.4–1.2)
Glucose, Bld: 93 mg/dL (ref 70–99)
Potassium: 4.1 mEq/L (ref 3.5–5.1)
Total Protein: 6.6 g/dL (ref 6.0–8.3)

## 2011-03-11 LAB — LIPASE, BLOOD: Lipase: 20 U/L (ref 11–59)

## 2011-03-11 LAB — POCT PREGNANCY, URINE: Preg Test, Ur: NEGATIVE

## 2011-03-11 LAB — HEMOCCULT GUIAC POC 1CARD (OFFICE): Fecal Occult Bld: NEGATIVE

## 2011-03-11 LAB — CBC
HCT: 44.3 % (ref 36.0–46.0)
Hemoglobin: 15.1 g/dL — ABNORMAL HIGH (ref 12.0–15.0)

## 2011-03-11 NOTE — Progress Notes (Signed)
Summary: Triage  Phone Note Call from Patient Call back at Home Phone [phone]   Caller: Patient Call For: Dr. Russella Dar Reason for Call: Talk to Nurse Summary of Call: Nausea, abd pain, constipation...taking Zofran and nothing is helping. Wants to know what other options she has. Initial call taken by: Karna Christmas,  March 07, 2011 3:58 PM  Follow-up for Phone Call        patient is having abdominal pain and continued constipation.  She feels awful doesn't know what to do.  Will come in and see Mike Gip PA on Monday at 2:30 Follow-up by: Darcey Nora RN, CGRN,  March 07, 2011 4:20 PM

## 2011-03-12 LAB — DIFFERENTIAL
Basophils Relative: 1 % (ref 0–1)
Eosinophils Absolute: 0.2 10*3/uL (ref 0.0–0.7)
Monocytes Relative: 5 % (ref 3–12)
Neutrophils Relative %: 77 % (ref 43–77)

## 2011-03-12 LAB — URINALYSIS, ROUTINE W REFLEX MICROSCOPIC
Bilirubin Urine: NEGATIVE
Glucose, UA: NEGATIVE mg/dL
Hgb urine dipstick: NEGATIVE
Ketones, ur: >80 mg/dL — AB
Nitrite: NEGATIVE
Protein, ur: NEGATIVE mg/dL
Specific Gravity, Urine: 1.014 (ref 1.005–1.030)
Urobilinogen, UA: 1 mg/dL (ref 0.0–1.0)
pH: 7.5 (ref 5.0–8.0)

## 2011-03-12 LAB — CBC
HCT: 44.6 % (ref 36.0–46.0)
Hemoglobin: 15.1 g/dL — ABNORMAL HIGH (ref 12.0–15.0)
MCHC: 33.9 g/dL (ref 30.0–36.0)
MCV: 86.9 fL (ref 78.0–100.0)
Platelets: 195 10*3/uL (ref 150–400)
RDW: 12.4 % (ref 11.5–15.5)
WBC: 9.8 10*3/uL (ref 4.0–10.5)

## 2011-03-12 LAB — POCT CARDIAC MARKERS
CKMB, poc: <1 ng/mL — ABNORMAL LOW (ref 1.0–8.0)
Myoglobin, poc: 39.1 ng/mL (ref 12–200)
Troponin i, poc: <0.05 ng/mL (ref 0.00–0.09)

## 2011-03-12 LAB — COMPREHENSIVE METABOLIC PANEL
ALT: 12 U/L (ref 0–35)
AST: 17 U/L (ref 0–37)
BUN: 3 mg/dL — ABNORMAL LOW (ref 6–23)
CO2: 24 mEq/L (ref 19–32)
Creatinine, Ser: 0.64 mg/dL (ref 0.4–1.2)
Glucose, Bld: 90 mg/dL (ref 70–99)
Potassium: 3.6 mEq/L (ref 3.5–5.1)
Sodium: 138 mEq/L (ref 135–145)
Total Bilirubin: 0.8 mg/dL (ref 0.3–1.2)

## 2011-03-16 LAB — BASIC METABOLIC PANEL
CO2: 28 mEq/L (ref 19–32)
Chloride: 102 mEq/L (ref 96–112)
Creatinine, Ser: 0.7 mg/dL (ref 0.4–1.2)
GFR calc Af Amer: >60 mL/min (ref >60)
Sodium: 137 mEq/L (ref 135–145)

## 2011-03-16 LAB — DIFFERENTIAL
Basophils Relative: 3 % — ABNORMAL HIGH (ref 0–1)
Eosinophils Absolute: 0.3 10*3/uL (ref 0.0–0.7)
Eosinophils Relative: 4 % (ref 0–5)
Monocytes Absolute: 0.6 10*3/uL (ref 0.1–1.0)
Monocytes Relative: 7 % (ref 3–12)

## 2011-03-16 LAB — CBC
HCT: 46.4 % — ABNORMAL HIGH (ref 36.0–46.0)
Hemoglobin: 15.8 g/dL — ABNORMAL HIGH (ref 12.0–15.0)
MCHC: 34 g/dL (ref 30.0–36.0)
MCV: 90.8 fL (ref 78.0–100.0)
RBC: 5.11 MIL/uL (ref 3.87–5.11)

## 2011-03-18 NOTE — Progress Notes (Signed)
Summary: Medication  Phone Note From Other Clinic   Caller: Tiiffany @ [facility] [phone] Call For: Dr. Arlyce Dice Summary of Call: Has questions about her meds.Marland KitchenMarland KitchenMarland KitchenWants to know if pt. has taken Amitiza before Initial call taken by: Karna Christmas,  March 07, 2011 2:56 PM  Follow-up for Phone Call        Left message to call back Follow-up by: Selinda Michaels RN,  March 10, 2011 8:51 AM  Additional Follow-up for Phone Call Additional follow up Details #1::        Called and l/M for Tiffany that pt was on Amitiza and it was removed off of her medication list on 3/2 Additional Follow-up by: Merri Ray CMA Duncan Dull),  March 10, 2011 8:54 AM

## 2011-03-18 NOTE — Assessment & Plan Note (Signed)
Summary: constipation/abdominal pain/sheri   History of Present Illness Visit Type: Follow-up Visit Primary GI MD: Melvia Heaps MD [facility] Primary Provider: Herb Grays, MD Requesting Provider: na Chief Complaint: Pt c/o abd pain all over and constipation  History of Present Illness:   Phyllis Garza 45 YO FEMALE KNOWN TO DR. KAPLAN,COMES IN WITH HER HUSBAND AND SON TODAY. SHE HAS RECENTLY UNDERGONE EGD AND COLONOSCOPY FOR C/O GENERALIZED ABDOMINAL DISCOMFOERT, NAUSEA, AND CONSTIPATION. EGD WAS NORMAL, AND COLON SHOWED  A HYPERPLASTIC POLYP. SHE SAYS HER CURRENT SXS STARTED IN OCTOBER AND SHE IS MISERABLE. SHE HAD A CT ABD/PELVIS IN 1/12 WHICH WAS NORMAL AS WELL.  SHE C/O SEVERE CONSTIPATION , AND SEVERAL DAYS PER WEEK OF NAUSEA, CRAMPY ABDOMINAL PAINS, AND INABILITY TO HAV A BM DESPITE MULTIPLE DOSES OF LACTULOSE PER DAY. SHE TRIED MIRALAX(6 DOSES PER DAY ) WITH APPARENTLY NO RESPONSE. SHE WAS GIVEN A TRIAL OF AMITIZA BUT SAYS IT MADE HER NAUSEATED, AND DIAPHORETIC SO SHE STOPPED IT. SHE IS NOW ON LACTULOSE  8-9 TABLESPOONS PER DAY? AND STIIL ONLY HAVING SMALL AMTS OF STOOL ONCE A WEEK OR SO. SHE DID HAVE  A BM TODAY. SHE HAS BEEN ON A VERY LIMITIED BLAND DIET, AND IS DRINKING ENSURE 2 X DAILY.HE WEIGHT IS STABLE PAST COUPLE MONTHS BUT HAS LOST 19 POUNDS SINCE THE FALL.  ON FURTHER DISCUSSION OF HER MEDS ETC, SHE SAYS SHE DID HAVE A DOSEAGE INCREASE IN HER LAMICTAL FROM 25 MG TO 100 MG DAILY IN OCTOBER NAD THAT IS WHEN ALL OF HER STOMACH SXS STARTED.    GI Review of Systems    Reports abdominal pain and  nausea.     Location of  Abdominal pain: generalized.    Denies acid reflux, belching, bloating, chest pain, dysphagia with liquids, dysphagia with solids, heartburn, loss of appetite, vomiting, vomiting blood, weight loss, and  weight gain.      Reports constipation.     Denies anal fissure, black tarry stools, change in bowel habit, diarrhea, diverticulosis, fecal incontinence, heme positive stool,  hemorrhoids, irritable bowel syndrome, jaundice, light color stool, liver problems, rectal bleeding, and  rectal pain.    Current Medications (verified): 1)  Citalopram Hydrobromide 20 Mg Tabs (Citalopram Hydrobromide) .... Take 1/2 Tab Twice Daily 2)  Clonazepam 0.5 Mg Tabs (Clonazepam) .... Take 1/2 To 1 Twice Daily As Needed For Anxiety 3)  Lamictal Odt 25 Mg Tbdp (Lamotrigine) .... As Directed 4)  Zofran 4 Mg Tabs (Ondansetron Hcl) .... Take As Needed Nausea 5)  Lactulose 10 Gm/57ml  Soln (Lactulose) .... 2 Tablespoons 2-4x Once Daily  Allergies (verified): 1)  ! Codeine Sulfate (Codeine Sulfate) 2)  ! Penicillin V Potassium (Penicillin V Potassium) 3)  ! Percocet (Oxycodone-Acetaminophen) 4)  ! Erythromycin Base (Erythromycin Base)  Past History:  Past Medical History: Anxiety Depression/BIPOLAR HYPERPLASTIC COLON POLYPS  Past Surgical History: 2 C-Sections Hysterectomy 2010 Tonsils-Uvuloplasty diviated septum Lt Ankle-pin plate and 6 screws Hernia Surgery COLONOSCOPY /EGD 1/12 -KAPLAN  Family History: Reviewed history from 01/09/2011 and no changes required. Mother- died of MI at 23 Father- died of MI at 27 Sister-breast cancer 49 Heart disease Stroke Asthma Migraine Family History of Colon Polyps:2 brothers No FH of Colon Cancer:  Social History: Reviewed history from 01/09/2011 and no changes required. Married 2 boys Patient currently smokes. 1/2 PPD Alcohol Use - no Illicit Drug Use - no Unemployed  Review of Systems       The patient complains of fatigue.  The patient denies allergy/sinus,  anemia, anxiety-new, arthritis/joint pain, back pain, blood in urine, breast changes/lumps, change in vision, confusion, cough, coughing up blood, depression-new, fainting, fever, headaches-new, hearing problems, heart murmur, heart rhythm changes, itching, menstrual pain, muscle pains/cramps, night sweats, nosebleeds, pregnancy symptoms, shortness of breath, skin  rash, sleeping problems, sore throat, swelling of feet/legs, swollen lymph glands, thirst - excessive , urination - excessive , urination changes/pain, urine leakage, vision changes, and voice change.         SEE HPI  Vital Signs:  Patient profile:   45 year old female Height:      65.5 inches Weight:      141 pounds BMI:     23.19 BSA:     1.72 Pulse rate:   88 / minute Pulse rhythm:   regular BP sitting:   124 / 68  (left arm) Cuff size:   regular  Vitals Entered By: Ok Anis CMA (March 10, 2011 2:09 PM)  Physical Exam  General:  Well developed, well nourished, no acute distress. Head:  Normocephalic and atraumatic. Eyes:  PERRLA, no icterus. Lungs:  Clear throughout to auscultation. Heart:  Regular rate and rhythm; no murmurs, rubs,  or bruits. Abdomen:  SOFT, NONDISTENDED, NO MASS OR HSM,BS+,MILD TENDERNESS ACROSS LOWER ABDOMEN Rectal:  NOT DONE Extremities:  No clubbing, cyanosis, edema or deformities noted. Neurologic:  Alert and  oriented x4;  grossly normal neurologically. Psych:  Alert and cooperative. Normal mood and affect.   Impression & Recommendations:  Problem # 1:  CONSTIPATION (ICD-564.00) Assessment Unchanged 45 YO FEMALE WITH MULTIPLE GI COMPLAINTS-PRIMARILY CONSTIPATION,NAUSEA, CRAMPY ABDOMINAL PAIN. SHE IS TAKING LARGE DOSES OF LAXATIVES  WHICH MAY ACTUALLY BE EXACERBATING HER PAIN ,  AND NAUSEA. SHE IS NOT DISTENDED, AND  DOUBT SHE IS ACTUALLY GOING WEEKS WITHOUT BM'S. SUSPECT HER LAMICTAL MAY BE CAUSING GI SIDE EFFECTS AS WELL.  REASSURANCE, LONG DISCUSSION SHE HAS FOLLOW UP PSYCH APPT TOMORROW-SHE WILL DISCUSS DECREASE IN LAMICTAL DECREASE LACTULOSE TO 2 TABLESPOONS TWICE DAILY REFILL ZOFRAN 4 MG 3 TIMES DAILY AS NEEDED KUB TODAY-ASSESS FOR OBSTIPATION. REVIEW OLD RECORDS IF NO RECENT ABDOMINAL US WILL OBTAIN FOLLOW UP IN 2-3 WEEKS . Orders: T-Abdomen 2-view ([id number])  Problem # 2:  PERSONAL HX COLONIC POLYPS (ICD-V12.72) Assessment: Comment  Only  Problem # 3:  BIPOLAR II DISORDER (ICD-296.89) Assessment: Comment Only  Problem # 4:  NAUSEA (ICD-787.02) Assessment: Comment Only SEE # 1  Patient Instructions: 1)  We sent the Refill for the Zofran 4 mg to your pharmacy. 2)  Stay on Lactulose 2 TB twice daily. 3)  Go to our Radiology department in our basement level. 4)  We will call you with the results.  5)  Copy sent to : Herb Grays, MD 6)  The medication list was reviewed and reconciled.  All changed / newly prescribed medications were explained.  A complete medication list was provided to the patient / caregiver. Prescriptions: ZOFRAN 4 MG TABS (ONDANSETRON HCL) Take 1 tab every 8 houes as needed for nausea  #30 x 0   Entered by:   Lowry Ram NCMA   Authorized by:   Sammuel Cooper PA-c   Signed by:   Lowry Ram NCMA on 03/10/2011   Method used:   Electronically to        [facility] [address] [phone]* (retail)       [address] [place], [place]  [address]       Ph: [phone]  Fax: [phone]   RxID:   [id number]

## 2011-04-06 LAB — CBC
Hemoglobin: 15.4 g/dL — ABNORMAL HIGH (ref 12.0–15.0)
RBC: 5.14 MIL/uL — ABNORMAL HIGH (ref 3.87–5.11)
RDW: 12.7 % (ref 11.5–15.5)

## 2011-04-06 LAB — DIFFERENTIAL
Basophils Relative: 1 % (ref 0–1)
Eosinophils Absolute: 0.3 10*3/uL (ref 0.0–0.7)
Eosinophils Relative: 3 % (ref 0–5)
Monocytes Relative: 6 % (ref 3–12)
Neutrophils Relative %: 66 % (ref 43–77)

## 2011-04-06 LAB — COMPREHENSIVE METABOLIC PANEL
ALT: 16 U/L (ref 0–35)
AST: 24 U/L (ref 0–37)
Alkaline Phosphatase: 51 U/L (ref 39–117)
CO2: 24 mEq/L (ref 19–32)
GFR calc Af Amer: >60 mL/min (ref >60)
Glucose, Bld: 93 mg/dL (ref 70–99)
Potassium: 4.2 mEq/L (ref 3.5–5.1)
Sodium: 141 mEq/L (ref 135–145)
Total Protein: 6.5 g/dL (ref 6.0–8.3)

## 2011-04-06 LAB — URINALYSIS, ROUTINE W REFLEX MICROSCOPIC
Glucose, UA: NEGATIVE mg/dL
pH: 6 (ref 5.0–8.0)

## 2011-04-06 LAB — POCT PREGNANCY, URINE: Preg Test, Ur: NEGATIVE

## 2011-05-16 NOTE — Op Note (Signed)
NAME:  Phyllis Garza, Phyllis Garza                         ACCOUNT NO.:  [id number]   MEDICAL RECORD NO.:  [id number]                   PATIENT TYPE:  OBV   LOCATION:  9325                                 FACILITY:  [facility]   PHYSICIAN:  Lenoard Aden, M.D.             DATE OF BIRTH:  [birth year]   DATE OF PROCEDURE:  05/04/2003  DATE OF DISCHARGE:                                 OPERATIVE REPORT   PREOPERATIVE DIAGNOSES:  1. Severe dysmenorrhea.  2. Dysfunctional uterine bleeding.  3. Symptomatic uterine fibroids.   POSTOPERATIVE DIAGNOSES:  1. Severe dysmenorrhea.  2. Dysfunctional uterine bleeding.  3. Symptomatic uterine fibroids.   PROCEDURE:  Laparoscopically-assisted vaginal hysterectomy.   SURGEON:  Lenoard Aden, M.D.   ASSISTANTS:  Chester Holstein. Earlene Plater, M.D., and Pershing Cox, M.D.   ANESTHESIA:  General.   ESTIMATED BLOOD LOSS:  350 mL.   URINE OUTPUT:  450 mL.   COMPLICATIONS:  None.   DISPOSITION:  Patient to recovery in good condition.   DESCRIPTION OF PROCEDURE:  After being apprised of the risks of anesthesia,  infection, bleeding, injury to abdominal organs and need for repair, the  patient was brought to the operating room, where she was administered a  general anesthetic without complications, prepped and draped in the usual  sterile fashion, and a Foley catheter placed.  After achieving adequate  anesthesia, exam under anesthesia reveals a well-supported midplane to  retroflexed uterus.  A Hulka tenaculum placed per vagina, infraumbilical  incision made with a scalpel, and Veress needle placed.  Opening pressure of  -2 noted, 5 L of CO2 insufflated without difficulty.  Patient pressure set  to 25.  Ten millimeter and 5 mm trocars are placed bilaterally in the  midaxillary line halfway between the symphysis pubis and umbilical port.  These were all placed atraumatically and under direct visualization.  Atraumatic trocar entry is visualized and confirmed.   Pictures are taken.  Normal liver, gallbladder bed, appendix is not visualized.  A bulky  midposition uterus is normal.  Bilateral normal ovaries and no evidence of  hydrosalpinx.  At this time the left tubo-ovarian round ligaments are  cauterized and divided bilaterally.  These pedicles are taken down to the  level of the uterine vessels using the tripolar in the process.  Bilaterally  the ureters are identified and are well out of the field.  The same  procedure is done on the right cardinal, broad ligament, and tubo-ovarian  ligament complexes with the tripolar cautery and ureters identified  bilaterally and seen after this dissection is performed.  Bladder is then  dissected sharply off the lower uterine segment, and some scarring is  encountered due to previous C-sections.  At this time attention then turned  to the vaginal portion of the procedure, whereby a weighted speculum was  placed.  The uterus was grasped using Jacobs tenaculum, infiltrated the  cervicovaginal junction using  a dilute Pitressin solution, and scored at the  cervicovaginal junction using electrocautery. Posterior cul-de-sac entry  made, a long weighted speculum placed.  The uterosacral ligaments are  bilaterally clamped and suture ligated to the vaginal cuff using Heaney  clamp and a 0 Vicryl suture.  At this time anterior cul-de-sac entry is  made.  The LigaSure is used to cauterize and divide the cardinal and uterine  vessel complexes.  The specimen is then removed.  Peritoneum is closed using  0 Vicryl in interrupting figure-of-eight fashion.  Good hemostasis is noted.  A well-supported vaginal cuff is appreciated.  A two-inch pack coated with  bacitracin cream is placed.  Attention is then turned to the laparoscopic  portion of the procedure, where bilaterally both ureters are identified.  Both ovaries appear normal.  The vaginal cuff is hemostatic.  No evidence of  any active bleeding.  The CO2 is then  released from the abdomen and trocars  are removed under direct visualization.  Umbilical port closed using a 0  Vicryl, 4-0 Vicryl, and Dermabond.  The two auxiliary ports are closed using  Dermabond.  The patient tolerates the procedure well and is transferred to  recovery in good condition.                                               Lenoard Aden, M.D.    RJT/MEDQ  D:  05/04/2003  T:  05/05/2003  Job:  [id number]

## 2011-05-16 NOTE — Discharge Summary (Signed)
NAME:  Phyllis Garza, Phyllis Garza NO.:  [id number]   MEDICAL RECORD NO.:  [id number]            PATIENT TYPE:   LOCATION:                                 FACILITY:   PHYSICIAN:  Elmore Guise., M.D.   DATE OF BIRTH:   DATE OF ADMISSION:  11/26/2006  DATE OF DISCHARGE:  11/27/2006                               DISCHARGE SUMMARY   DISCHARGE DIAGNOSIS:  1. Chest pain.  2. Normal-appearing coronary arteries by cardiac catheterization.  3. History of bipolar disorder.  4. Tobacco dependence.  5. History of methicillin-sensitive Staph aureus infection on her left      lower leg.  6. Hypertension.  7. Palpitations.   HISTORY OF PRESENT ILLNESS:  The patient is A very pleasant 45 year old  white female with bipolar disorder, continued tobacco dependence, and  hypertension who presented with increasing exertional chest pain.   HOSPITAL COURSE:  The patient was admitted to telemetry monitoring.  She  had serial cardiac enzymes performed which were negative.  Because of  her symptoms, she underwent cardiac catheterization on 11/27/2006 which  showed normal-appearing coronary arteries with left dominant system.  She had normal LV function with an EF 55-60%; and no wall motion  abnormalities.  She has now remained chest pain free since admission.  We did discuss complete tobacco cessation as well as dietary changes to  help with her symptoms.   She will be discharged home on the following medications:  1. Aspirin 81 mg daily.  2. Toprol XL 25 mg daily.  3. Hydrochlorothiazide 25 mg daily.  4. Wellbutrin 150 mg daily.  5. Zoloft 50 mg daily.  6. Klonopin 0.25 mg twice daily.  7. Tylenol Extra Strength on a p.r.n. basis.   She will increase her activity slowly.  She will have routine post  AngioSeal restrictions (no heavy lifting or strenuous activity for the  next 48 hours, no tub baths).  We did discuss her restrictions with her  prior to discharge.  I also recommended  complete smoking cessation.  She  will follow up with Dr. Ronny Flurry in 4 weeks, and keep her scheduled  point with Dr. Herb Grays in the next couple of weeks.   Her blood work, on day of discharge, showed a white count of 7.9,  hemoglobin of 14.3, platelet count of 174.  Potassium level of 3.5 with  a BUN and creatinine of 6 and 0.6.  She had normal LFTs, normal cardiac  enzymes.  She had a total cholesterol of 147, triglycerides of 76, HDL  of 38, LDL of 94      Elmore Guise., M.D.  Electronically Signed     TWK/MEDQ  D:  11/27/2006  T:  11/28/2006  Job:  [id number]   cc:   Cassell Clement, M.D.  Tammy R. Collins Scotland, M.D.

## 2011-05-16 NOTE — H&P (Signed)
NAMEMarland Kitchen  Phyllis, STELZNER NO.:  [id number]   MEDICAL RECORD NO.:  [id number]          PATIENT TYPE:  OBV   LOCATION:  2021                         FACILITY:  [facility]   PHYSICIAN:  Cassell Clement, M.D. DATE OF BIRTH:  [birth year]   DATE OF ADMISSION:  11/26/2006  DATE OF DISCHARGE:  11/27/2006                              HISTORY & PHYSICAL   CHIEF COMPLAINT:  Chest pain.   HISTORY:  This is a 45 year old married Caucasian female admitted with  severe left-sided chest pain.  She was shopping at that time that the  pain started.  She had previously eaten lunch.  She developed  diaphoresis and nausea but no vomiting.  She was close to where Dr.  Herb Grays has her office and went to the office there, and Dr. Collins Scotland  checked her and noted questionable a slight change in the inferior wall  ST-T wave segments.  The patient was given four baby aspirin and  sublingual nitroglycerin at Dr. Alda Berthold office and because of continued  intensity of the pain at a level of 8/10, EMS was called and she was  transported to [facility] emergency room, where she was evaluated and  admitted.   The patient has an interesting past medical history.  She states that  more than 10 years ago she was told by her cardiologist at that time,  Dr. Meryl Crutch, that she has had one or two heart attacks in the  past based upon a cardiac catheterization that he had done which showed  scar tissue.  His records are not available.  She was also told of  high cholesterol in the past, although recent cholesterols in the office  have been normal with a total cholesterol of 155 and an LDL of 94 noted  in August 2006.  She is on no cholesterol-lowering medication but does  try to watch her diet.  Of note is the fact that she was admitted here  in August 2006 and was removed for an MI at that time with negative  enzymes.  She had had a prior stress Cardiolite in our office in  February 2006.  A August 2006, she  also had a 2-D echo which was done at  [facility] and showed normal LV function, mild mitral regurgitation, but no  definite prolapse.   PRESENT MEDICATIONS:  1. Aspirin 81 mg a day.  2. Hydrochlorothiazide 25 mg daily.  3. Zoloft 50 mg daily.  4. Wellbutrin 150 mg daily.  5. Toprol one daily.  6. __________ 0.5 mg taking half a tablet b.i.d. to q.i.d.   FAMILY HISTORY:  Strongly positive for coronary disease.  Her father  died of his third heart attack at age 78.  Mother died of ischemic heart  disease of age 4.   SOCIAL HISTORY:  She is married.  She does smoke five cigarettes a day.  She has a history of bipolar illness and is not allowed by her  psychiatrist to drink any alcohol.  Her psychiatrist is Lynden Ang,  M.D.   PAST MEDICAL HISTORY:  She has had C-sections twice.  She has recently  been treated by Dr. Collins Scotland for MRSA skin lesions on her legs.  The  patient has a remote history of a right ankle fracture treated with  plate and screws.  Remainder of review of systems is negative in detail.   PHYSICAL EXAMINATION:  VITAL SIGNS:  Her blood pressure is 119/80, the  pulse 71 and regular, respirations are normal.  HEENT: Negative.  Pupils equal and reactive.  NECK:  The carotids are normal.  Jugular venous pressure normal.  Thyroid normal.  CHEST:  Clear to auscultation.  HEART:  No murmur, gallop, rub or click.  ABDOMEN:  Soft without hepatosplenomegaly or masses.  EXTREMITIES:  No phlebitis or edema.   The electrocardiogram shows normal sinus rhythm and is otherwise within  normal limits in the emergency room.  Her chest x-ray is normal.  Laboratory studies are pending.   IMPRESSION:  1. Chest pain, rule out acute coronary syndrome, rule out myocardial      infarction.  2. Past history of remote heart attacks, which are not well-documented      at this time.  3. History of bipolar illness, followed by Dr. Wynonia Lawman.  4. History of methicillin-resistant Staphylococcus  aureus skin      infection of her legs.   DISPOSITION:  Were going to admit to a telemetry bed.  We are going to  get serial enzymes and EKGs.  We are going to treat with Lovenox and  sublingual nitroglycerin and with aspirin and beta blockers.  Anticipate  she will get a cardiac catheterization in a.m. by [facility].           ______________________________  Cassell Clement, M.D.     TB/MEDQ  D:  11/26/2006  T:  11/27/2006  Job:  [id number]   cc:   Tammy R. Collins Scotland, M.D.

## 2011-05-16 NOTE — Op Note (Signed)
NAME:  Phyllis Garza, Phyllis Garza               ACCOUNT NO.:  [id number]   MEDICAL RECORD NO.:  [id number]          PATIENT TYPE:  AMB   LOCATION:  DAY                          [facility]ACILITY:  [facility]   PHYSICIAN:  Anselm Pancoast. Weatherly, M.D.DATE OF BIRTH:  [birth year]   DATE OF PROCEDURE:  04/07/2007  DATE OF DISCHARGE:                               OPERATIVE REPORT   PREOPERATIVE DIAGNOSIS:  Umbilical and supraumbilical hernia.   POSTOPERATIVE DIAGNOSIS:  Umbilical and supraumbilical hernia.   OPERATION:  Repair of umbilical and supraumbilical hernia with mesh in  the preperitoneal space.   ANESTHESIA:  General.   SURGEON:  Anselm Pancoast. Zachery Dakins, M.D.   ASSISTANT:  Nurse   HISTORY:  Phyllis Garza is a 45 year old female who had originally seen  Dr. Daphine Deutscher for an umbilical hernia.  At that time, she has a long list  of medications, was afraid of having surgery supposedly because of  previous coronary heart problems and started having more pain with  swelling above her umbilicus and I saw her one of the times Dr. Daphine Deutscher  was not available and, on examination, she has an obvious supraumbilical  hernia that is about the size of a plum and I talked with Dr. Patty Sermons,  Dr. Herb Grays I think is her regular physician, and we were of the  opinion that really this is not a coronary problem, that she should do  fine with surgery, and she reluctantly has changed and decided she will  proceed with surgery.  Supposedly about a year ago, she had some  problems with infections on her legs that may have been MRSA, there is  no evidence of any infection or anything at this time.  She does have an  umbilical ring in her navel and I have asked her to remove that when I  saw her in the office in preparation for surgery.  She has a long list  of allergies, codeine, penicillin, Ativan, Percocet, etc.   DESCRIPTION OF PROCEDURE:  The patient was taken to the operating suite.  I gave her 400 mg of Cipro, induction of  general anesthesia,  endotracheal tube, and then the abdomen was prepped with Betadine  scrubbing solution and draped in a sterile manner.  I made a vertical  incision above the umbilicus and used sharp dissection to elevate the  skin from the overlying hernia that was protruding out through a midline  defect and there was another defect at the umbilicus.  I opened up into  the hernia sac and separated the sac in the preperitoneal space from the  overlying fascia and cut the bridge between the two and then hemostasis  was obtained with 0 Vicryl.  Then, I could close the peritoneum and the  preperitoneal space so that I could put a piece of Prolene mesh, about a  3 by 1 1/2 inch piece, vertically anchoring it through the abdominal  fascia with interrupted sutures of 0 Surgilon and then closed the fascia  over this incorporating the center part of the mesh also with 0  Surgilon.  I anesthetized the fascia with Marcaine  and then closed the  subcutaneous tissue with 3-0 Vicryl and then closed the skin with  interrupted 4-0 nylon.  The patient tolerated the procedure nicely and  was extubated and taken to the recovery room in stable postop condition.   I think she can be released after a short stay in the recovery room, I  am not sure what pain medicine we will give her.  I think since she has  had a remote history of possible MRSA, then I am going to put her on  Doxycycline for about a week just in case she is colonized with MRSA.           ______________________________  Anselm Pancoast. Zachery Dakins, M.D.     WJW/MEDQ  D:  04/07/2007  T:  04/07/2007  Job:  [id number]   cc:   Tammy R. Collins Scotland, M.D.  Fax: [phone]

## 2011-05-16 NOTE — Op Note (Signed)
[facility]  Patient:    Phyllis Garza                       MRN: [id number] Proc. Date: 01/10/00 Adm. Date:  [id number] Attending:  Genia Del                           Operative Report  PREOPERATIVE DIAGNOSIS:       Intrauterine pregnancy at [redacted] weeks gestational age, labor, previous cesarean section, request for repeat cesarean section.  POSTOPERATIVE DIAGNOSIS:      Intrauterine pregnancy at [redacted] weeks gestational age, labor, previous cesarean section, request for repeat cesarean section.  OPERATION:                    Repeat low transverse cesarean section.  SURGEON:                      Sung Amabile. Roslyn Smiling, M.D.  ASSISTANT:                    Hayes Ludwig, F.N.P.  ANESTHESIA:                   Spinal.  ESTIMATED BLOOD LOSS:         600 cc.  TUBES AND DRAINS:             None.  COMPLICATIONS:                None.  FINDINGS:                     Live occipitotransverse position female, 3465 grams  with Apgars of 8 and 9.  Copious amount of clear fluid.  Normal tubes, ovaries, and uterus.  Normal appearing placenta with three vessel cord.  SPECIMENS:                    Cord bloods to pathology.  INDICATIONS:                  A 45 year old woman, gravida 2, para 1, at [redacted] weeks gestational age in labor.  The patient has had a previous cesarean section and  repeat cesarean section is requested.  She has been counseled regarding the benefits and risks of the procedure prior to surgery.  DESCRIPTION OF PROCEDURE:     After the establishment of spinal anesthesia, the  patient was placed in the left uterine displacement position.  The abdomen was prepped and Foley catheter was placed and the patient was draped.  A Pfannenstiel incision was made on the skin with a knife through the previous scar.  The incision was carried to the level of the fascia using electrocautery.  The fascia was nicked in the midline.  The fascial incision was  carried laterally using scissors. The fascia was separated from the underlying rectus muscle bellies with a combination of sharp and blunt dissection.  The upper border of the fascia was retracted cephalad.  The rectus muscle bellies were split in the midline sharply.  The peritoneum was visualized in a clear space and entered there bluntly.  The peritoneal incision was carried superiorly and inferiorly taking care to avoid injury to underlying viscera.  The bladder blade was placed.  Uterine position as ascertained.  The vesicouterine serosa was opened over the lower uterine segment transversely.  The bladder flap was  developed bluntly.  The bladder blade was replaced on the bladder flap.  A low transverse incision was made on the uterus with the knife.  The incision as carried laterally with bandage scissors.  Copious amount of clear amniotic fluid was noted.  The baby was delivered and the cord was clamped and cut.  The babys  nasopharynx was suctioned.  The baby was handed off the table to the awaiting pediatricians.  Cord bloods were taken.  The placenta was removed spontaneously. The uterus was wiped clean of clot.  The intrauterine cavity was noted to be normal.  The cervix was dilated with the [facility] clamp.  Ring forceps were used to identify the uterine incisional angles.  The uterus was closed in layers.  The first layer was a running interlocking stitch of 0 Monocryl.  The second layer was an imbricating stitch of 0 Monocryl. Bleeding area in the midportion of the incision was controlled with a mattress suture of 0 Monocryl.  Hemostasis was noted.  Electrocautery was used for small bleeders along the serosa.  The surgical site was irrigated with warm normal saline. Hemostasis was noted.  The reproductive organs were noted to be normal.  Initial sponge, needle, and instrument counts were correct.  The anterior abdominal wall was closed in layers.  The rectus muscle  bellies were reapproximated with two interrupted sutures of 0 Monocryl.  The subfascial layer was irrigated with warm normal saline.  Electrocautery was used for hemostasis.  The fascia was closed from either angle using running stitch of 0 Vicryl tying n the midline independently.  The fat was irrigated with warm normal saline. Electrocautery was used for hemostasis.  0.25% Marcaine (10 cc) was used to infiltrate the skin edges.  The skin was reapproximated with surgical staples.  Final sponge, needle, and instrument counts were correct.  The urine was clear t the end of the case.  The patient was transported to the recovery room in satisfactory condition. DD:  01/10/00 TD:  01/11/00 Job: [id number]

## 2011-05-16 NOTE — Discharge Summary (Signed)
NAMEMarland Kitchen  Phyllis Garza, Phyllis Garza               ACCOUNT NO.:  [id number]   MEDICAL RECORD NO.:  [id number]          PATIENT TYPE:  INP   LOCATION:  5020                         FACILITY:  [facility]   PHYSICIAN:  Hollice Espy, M.D.DATE OF BIRTH:  [birth year]   DATE OF ADMISSION:  07/20/2006  DATE OF DISCHARGE:                                 DISCHARGE SUMMARY   DISCHARGE DIAGNOSES:  1. Anterior left leg abscess below the knee with methicillin sensitive      staph aureus.  2. Bipolar disorder.  3. Tobacco abuse.  4. 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   DISCHARGE MEDICATIONS:  1. Avelox 400 mg one p.o. daily x14 days.  2. Nicotine patch 100 mg topically daily.  3. Aspirin 81 p.o. daily.  4. Digoxin 0.125 p.o. daily.  5. Hydrochlorothiazide 12.5 p.o. daily.  6. Klonopin 0.5 p.o. b.i.d. and 0.25 p.o. q. 8 hours p.r.n.  7. Lamictal 150 mg p.o. b.i.d.  8. Wellbutrin 150 p.o. daily.  9. Zoloft 12.5 p.o. nightly.   HOSPITAL COURSE:  The patient is a 45 year old white female who 1 day prior  to admission was seen in urgent care secondary to a skin infection, was  found that she had a small abscess secondary to shaving razor bumps on her  legs.  She was started on doxycycline and sent home.  She returned to the  emergency room about 24 hours later when wound had increased in size.  She  had a debridement performed in the emergency room and was admitted for IV  antibiotics.  The patient was started on IV vancomycin, however when the  vancomycin was started, she developed a drug rash with hives and this was  discontinued.  She was started then on Zyvox and Cipro.  She tolerated this  well.  Cultures were sent and cultures came back on July 23, 2006 for staph  aureus however this was intermediately sensitive to doxycycline.  She had  been tolerating Cipro and Zyvox well.  Her white count had stayed normal.  She had no fevers and her wound continued to improve.   Wound care saw the  patient.  They recommended daily iodoform dressing.  At this point once her  cultures came back I discussed with the patient and the plan will be to  discharge the patient to home.   DISPOSITION:  Improved with 14 more days of antibiotics.  She will have home  health follow to help with her dressing changes.  I am letting her increase  her activity slowly as long as she keeps her wound area clean.  She is  cleared to return back to work on July 27, 2006.  Her discharge diet will be  a regular diet.  The patient will followup  with Dr. Collins Scotland in 1 week.  Additionally recommended the patient stop smoking  for now as this may contribute to poor wound healing.  She tells me that she  was wearing nicotine patch.  She will resume all of her previous  medications.  Her other medical issues were stable.  Hollice Espy, M.D.  Electronically Signed     SKK/MEDQ  D:  07/23/2006  T:  07/23/2006  Job:  [id number]   cc:   Tammy R. Collins Scotland, M.D.

## 2011-05-16 NOTE — Discharge Summary (Signed)
[facility]  Patient:    Phyllis Garza                       MRN: [id number] Adm. Date:  [id number] Disc. Date: [id number] Attending:  Genia Del Dictator:   [phone]                           Discharge Summary  DISCHARGE DIAGNOSES:          1. Intrauterine pregnancy at [redacted]                                  weeks gestational age, delivered.                               2. Previous cesarean section.                               3. Requests repeat cesarean section.                               4. Anxiety disorder.                               5. Polyhydramnios.  OPERATIVE PROCEDURE:         Repeat low transverse cesarean section on January 10, 2000.  HOSPITAL COURSE:              A 45 year old woman, G2, P40, [facility] January 30, 2000 admitted at 37-1/[redacted] weeks gestational age because of regular uterine contractions.  She had been seen earlier in the day for what was felt to be prodromal labor.  She returned in the afternoon with increasing uterine contractions.  There was no rupture of membranes or bleeding.  The patient requested a repeat cesarean section.  The antenatal course was remarkable for:                                1. Anxiety disorder treated with                                  Zoloft during the pregnancy.                               2. A 50 pound weight gain with a                                  normal one-hour Glucola.                               3. Polyhydramnios.                               4. Preterm labor treated until  recently with Procardia.  Early labor was diagnosed.  The patient was consented for cesarean section and taken to the operating room, where she underwent repeat low transverse cesarean section by Dr. Roslyn Smiling and Hayes Ludwig, Family Nurse Practitioner.  Spinal anesthesia was used and estimated blood loss was 600 cc.  There were no complications. The baby was a live  occiput transverse female, 3465 g with APGAR of eight and nine.  The patients postoperative course was unremarkable.  Her hemoglobin stabilized at 12.1.  Diet was advanced without difficulty.  Surgical staples were removed postpartum.  She was given routine status post cesarean section instructions to be followed up at the [facility] in four to six weeks.  DISCHARGE MEDICATIONS:        prenatal vitamins.  Motrin, 600 mg q.6h. p.r.n. pain, #60 prescribed.  Tylox, #10 prescribed. DD:  02/18/00 TD:  02/19/00 Job: [id number]

## 2011-05-16 NOTE — Op Note (Signed)
   NAME:  Phyllis Garza, Phyllis Garza                         ACCOUNT NO.:  [id number]   MEDICAL RECORD NO.:  [id number]                   PATIENT TYPE:  AMB   LOCATION:  DSC                                  FACILITY:  [facility]   PHYSICIAN:  Harvie Junior, M.D.                DATE OF BIRTH:  1966-03-14   DATE OF PROCEDURE:  07/20/2002  DATE OF DISCHARGE:  07/21/2002                                 OPERATIVE REPORT   PREOPERATIVE DIAGNOSES:  Displaced right distal fibular fracture with  mortise widening.   POSTOPERATIVE DIAGNOSES:  Displaced right distal fibular fracture with  mortise widening.   OPERATION PERFORMED:  Open reduction internal fixation of right mortise  widening.   SURGEON:  Harvie Junior, M.D.   ASSISTANT:  Currie Paris. Thedore Mins.   ANESTHESIA:  General.   INDICATIONS FOR PROCEDURE:  The patient is a 45 year old female with a history  of having suffered a distal fibula fracture.  Ultimately, we tried  conservative treatment but it failed.  There was mortise widening with  rotation of the fibula.  I felt that the most conservative choice at that  point was open reduction internal fixation of the distal fibula fracture and  he was brought to the operating room for this procedure.   DESCRIPTION OF PROCEDURE:  The patient was brought to the operating room and  after adequate anesthesia was obtained with general anesthetic, the patient  was placed supine on the operating table.  The right leg was prepped and  draped in the usual sterile fashion.  Follow this, the leg was  exsanguinated.  The blood pressure was inflated to 350 mmHg.  Following  this, a lateral incision was made.  Subcutaneous tissues were dissected down  to the level of the fibular fracture.  The fibular fracture was identified  and cleansed of all healing elements, anatomically reduced and fixed with an  interfragmentary screw and then a five hole plate to neutralize the  fracture.  Fluoroscopy was used throughout  the case to make sure that the  mortise closed down and in fact it did.  At that point the wound was  copiously irrigated and suctioned dry.  The ankle was closed in layers.  A  sterile compressive dressing was applied.  The patient was taken to the  recovery room where she was noted to be in satisfactory condition.  The  estimated blood loss for this procedure was none.                                                 Harvie Junior, M.D.    Ranae Plumber  D:  09/05/2002  T:  09/05/2002  Job:  [id number]

## 2011-05-16 NOTE — Discharge Summary (Signed)
NAMEMarland Kitchen  TRAVIS, PURK NO.:  [id number]   MEDICAL RECORD NO.:  [id number]          PATIENT TYPE:  INP   LOCATION:  2012                         FACILITY:  [facility]   PHYSICIAN:  Cassell Clement, M.D. DATE OF BIRTH:  17-May-1966   DATE OF ADMISSION:  08/19/2005  DATE OF DISCHARGE:  08/21/2005                                 DISCHARGE SUMMARY   FINAL DIAGNOSES:  1.  Chest pain probably noncardiac, myocardial infarction ruled out.  2.  Trivial mitral regurgitation.  3.  History of essential hypertension.  4.  History of bipolar disorder.  5.  History of hyperlipidemia.   OPERATIONS PERFORMED:  None.   HISTORY:  This is a 45 year old Caucasian female who was admitted as an  emergency on August 19, 2005, with chest pain. She complained of left  lateral chest pain and back pain. She gave a history of previous myocardial  infarctions, although we had no documentation of that. She had a history of  bipolar disorder, hypertension and dyslipidemia.   Physical examination as recorded by Dr. Waldon Reining was unremarkable.   She was admitted to a monitored bed and serial cardiac enzymes were  obtained. She was treated with nitroglycerin and enteric coated aspirin and  Lovenox and Toprol was continued. She was advised to quit smoking. The  patient had uneventful hospital course. She had a previous normal Cardiolite  stress test within the last year in our office and this was not repeated. We  did do an echocardiogram which showed normal left ventricular systolic and  diastolic function and mild mitral regurgitation.  Physical exam showed a  very faint apical systolic murmur heard only in the left lateral decubitus  position.  She had no arrhythmias. Her activity was increased rapidly and  she was able to be discharged home improved on August 21, 2005. She received  counseling regarding tobacco cessation. We also counseled her regarding the  need for SBE prophylaxis because of the  mitral regurgitation murmur.   The patient is being discharged improved on:  1.  Coated aspirin 81 mg daily.  2.  Toprol 25 mg daily.  3.  Lamictal 100 mg daily.  4.  Depakote 500 mg 1-3 tablets at bedtime p.r.n.  5.  Klonopin 0.5 mg half a tablet in the morning and full tablet in the      evening.  6.  Hydrochlorothiazide 25 mg half tablet daily.  7.  K-tab 10 mEq daily.   She had mild hypokalemia on admission and we are reducing the dose of her  hydrochlorothiazide and also adding the potassium supplementation. She will  return to the office in one month for follow-up visit and will get a fasting  lipid panel and CMET at that time. She is to keep her regular appointments  with her psychiatrist, Dr. Evelene Croon.           ______________________________  Cassell Clement, M.D.     TB/MEDQ  D:  08/21/2005  T:  08/21/2005  Job:  [id number]   cc:   Milagros Evener, M.D.  [address] [id number]  [place], [place] [address]  Fax: [phone]

## 2011-05-16 NOTE — Cardiovascular Report (Signed)
NAME:  Phyllis, Garza NO.:  [id number]   MEDICAL RECORD NO.:  [id number]          PATIENT TYPE:  OBV   LOCATION:  2021                         FACILITY:  [facility]   PHYSICIAN:  Elmore Guise., M.D.DATE OF BIRTH:  30-May-1966   DATE OF PROCEDURE:  11/27/2006  DATE OF DISCHARGE:  11/27/2006                            CARDIAC CATHETERIZATION   DESCRIPTION OF PROCEDURE:  The patient was brought to the cardiac cath  lab after appropriate informed consent.  She was prepped and draped in a  sterile fashion.  Approximately 10 mL of 1% lidocaine was used for local  anesthesia.  A 6-French sheath was placed in the right femoral artery  without difficulty.  Coronary angiography, LV angiography and limited  right femoral angiography were then performed.  Angio-Seal closure  device was deployed without difficulty.   FINDINGS:  1. Separate ostia noted for circumflex and LAD vessels.  2. LAD:  Normal appearing  3. D1:  Small vessel, normal appearing  4. D2:  Moderate sized vessel, normal appearing  5. LCX:  Dominant normal appearing.  6. OM1/OM2/OM3:  Moderate to large size vessels, all normal appearing  7. RCA:  Small nondominant normal appearing.  8. LV:  EF 55-60%. No wall motion abnormalities.  LVEDP is 33 mmHg.  9. Limited right femoral angiogram showed no significant obstructive      disease.  Appropriate arteriotomy site for Angio-Seal closure      device was noted.  Angio-Seal closure device was deployed without      difficulty.   The patient tolerated the procedure well and was transferred from the  cardiac cath lab in stable condition.   IMPRESSION:  1. Normal appearing coronary arteries.  2. Preserved LV systolic function with an EF of 55-60%.   PLAN:  Aggressive risk factor modification with complete tobacco  cessation and blood pressure management as indicated.      Elmore Guise., M.D.  Electronically Signed     TWK/MEDQ  D:  11/27/2006  T:   11/27/2006  Job:  [id number]   cc:   Cassell Clement, M.D.  Tammy R. Collins Scotland, M.D.

## 2011-05-16 NOTE — H&P (Signed)
   NAME:  Phyllis Garza, Phyllis Garza                         ACCOUNT NO.:  [id number]   MEDICAL RECORD NO.:  [id number]                   PATIENT TYPE:  OBV   LOCATION:  9325                                 FACILITY:  [facility]   PHYSICIAN:  Lenoard Aden, M.D.             DATE OF BIRTH:  Oct 29, 1966   DATE OF ADMISSION:  05/04/2003  DATE OF DISCHARGE:                                HISTORY & PHYSICAL   CHIEF COMPLAINT:  1. Severe dysmenorrhea.  2. Symptomatic fibroids.   HISTORY OF PRESENT ILLNESS:  The patient is a 45 year old white female (G2,  P2) with a history of C-section, history of tubal ligation and with multiple  medical problems.  She presents with worsening dysmenorrhea and menorrhagia.   MEDICATIONS:  Lexapro, Seroquel, Aygestin, Phenergan, clorazepate, TobraDex,  Motrin and Ambien.   PAST MEDICAL HISTORY:  History of two C-sections and a tubal ligation.  No  other surgical hospitalizations.   SOCIAL HISTORY:  Personal history of complicated depression and bipolar  disorder, for which she takes multiple medications.   PHYSICAL EXAMINATION:  GENERAL:  She is a well developed, well nourished  white female, in no apparent distress.  HEENT:  Normal.  LUNGS:  Clear.  HEART:  Regular rate and rhythm.  ABDOMEN:  Soft and nontender.  GENITOURINARY:  Uterus is bulky, retroflexed and no adnexal masses.  EXTREMITIES:  No cords.  NEUROLOGIC:  Nonfocal.   IMPRESSION:  Severe dysmenorrhea and menorrhagia, with symptomatic uterine  fibroids.   PLAN:  Proceed with LAVH and possible TAH.  The risks of anesthesia,  infection, bleeding, injury to abdominal organs and need for repair was  discussed.  Delay versus immediate complications to include bowel and  bladder injury are noted.  The patient acknowledges and wishes to proceed.                                               Lenoard Aden, M.D.    RJT/MEDQ  D:  05/04/2003  T:  05/04/2003  Job:  [id number]

## 2011-05-16 NOTE — H&P (Signed)
NAME:  ARHIANNA, EBEY NO.:  [id number]   MEDICAL RECORD NO.:  [id number]          PATIENT TYPE:  EMS   LOCATION:  MAJO                         FACILITY:  [facility]   PHYSICIAN:  Ulyses Amor, MD DATE OF BIRTH:  April 06, 1966   DATE OF ADMISSION:  08/19/2005  DATE OF DISCHARGE:                                HISTORY & PHYSICAL   Phyllis Garza is a 45 year old white woman who is admitted to [facility] with back and left axilla pain in order to exclude an  acute myocardial infarction.   The patient presented to the emergency department saying that she had a  history of multiple myocardial infarctions in the past. Her specific  complaint today is that of a sharp, mid-back pain associated with an ache in  her left axilla. This began approximately four hours ago while she was  accompanying her son at band practice. There has been no dyspnea,  diaphoresis, or nausea. There were no exacerbating or ameliorating factors.  The discomfort appears not to be related to position, activity, meals, or  respirations. She was given some nitroglycerin in the emergency department  which she thinks had some helpful effect. In the last several hours, her  discomfort appears to have resolved.   There is no documentation in the medical records here at [facility] with respect to a history of cardiac disease. These records  apparently reside at Dr. Yevonne Pax office.   The patient has a history of hypertension and dyslipidemia. She smokes one-  half pack of cigarettes per day. There is no history of diabetes mellitus.  Her father reportedly suffered three myocardial infarctions, the last one  fatal, in his 30s.   There was no history of congestive heart failure or arrhythmia. The  patient's only other medical problem is that of a bipolar disorder.   MEDICATIONS:  Toprol XL, hydrochlorothiazide, Depakote, and Klonopin.   ALLERGIES:   PENICILLIN.   OPERATIONS:  Hysterectomy.   SIGNIFICANT INJURIES:  None.   SOCIAL HISTORY:  The patient is separated from her husband. She lives with  her two children.   FAMILY HISTORY:  As described above.   REVIEW OF SYSTEMS:  Reveals no new problems related to her head, eyes, ears,  nose, mouth, throat, lungs, gastrointestinal system, genitourinary system,  or extremities. There is no history of neurologic or psychiatric disorder.  There was no history of fever, chills, or weight loss.   PHYSICAL EXAMINATION:  VITAL SIGNS:  Blood pressure 150/94, pulse 91 and  regular, respirations 20, temperature 98.3.  GENERAL:  The patient was a middle-aged white woman in no discomfort. She  was alert, oriented, and appropriate.  HEENT:  Normal. The neck was without thyromegaly or adenopathy. Carotid  pulses were palpable bilaterally and without bruits.  CARDIAC:  Revealed a normal S1 and S2. There was no S3, S4, murmur, rub, or  click. Cardiac rhythm was regular. No chest wall tenderness was noted.  LUNGS:  Clear.  ABDOMEN:  Soft and nontender. There was no mass, hepatosplenomegaly, bruit,  distension, rebound, guarding, or  rigidity. Bowel sounds were normal.  BREASTS:  Not performed as they were not pertinent for the reason for acute  care hospitalization.  PELVIC:  Not performed as they were not pertinent for the reason for acute  care hospitalization.  RECTAL:  Not performed as they were not pertinent for the reason for acute  care hospitalization.  EXTREMITIES:  Without edema, deviation, or deformity. Radial and dorsalis  pedis pulses were palpable bilaterally.  NEUROLOGICAL:  Brief screening neurologic survey was unremarkable.   The electrocardiogram was normal. Chest radiograph, according to the  radiologist, was normal. Potassium was 3.6, BUN 4, and creatinine 0.7.  Myoglobin was 22.4 with a CK-MB of less than 1.0 and troponin of less than  0.05. The remaining studies were  pending at the time of this dictation.   IMPRESSION:  1.  Back pain, left axilla pain:  Rule out myocardial infarction.  2.  History of multiple myocardial infarctions. No documentation here at      [facility]. We will obtain records from Dr.      Yevonne Pax office.  3.  Hypertension.  4.  Dyslipidemia.  5.  Bipolar disorder.   PLAN:  1.  Telemetry.  2.  Serial cardiac enzymes.  3.  Lovenox.  4.  Aspirin.  5.  Nitroglycerin paste.  6.  Toprol XL.  7.  Obtain office records from Dr. Patty Sermons.  8.  Discontinuation of smoking discussed.  9.  Fasting lipid profile.      Ulyses Amor, MD  Electronically Signed     MSC/MEDQ  D:  08/20/2005  T:  08/20/2005  Job:  [id number]   cc:   Cassell Clement, M.D.  [address]  [place]  [place] [address]  Fax: [phone]

## 2011-05-16 NOTE — Discharge Summary (Signed)
NAMEMarland Garza  DEIJA, BUHRMAN NO.:  [id number]   MEDICAL RECORD NO.:  [id number]          PATIENT TYPE:  OBV   LOCATION:  2021                         FACILITY:  [facility]   PHYSICIAN:  Cassell Clement, M.D. DATE OF BIRTH:  [birth year]   DATE OF ADMISSION:  11/26/2006  DATE OF DISCHARGE:  11/27/2006                               DISCHARGE SUMMARY   FINAL DIAGNOSES:  1. Chest pain.  2. Past history of hypercholesterolemia.  3. Tobacco abuse.  4. Bipolar illness.  5. History of methicillin-resistant Staphylococcus aureus skin      infection of legs.   OPERATIONS PERFORMED:  Left heart cardiac catheterization on November 27, 2006 by Dr. Lady Deutscher.   HISTORY:  This 45 year old Caucasian female was admitted with severe  left-sided chest pain.  The pain began while she was shopping on  November 26, 2006.  She was near the office of Dr. Herb Grays and went  to see Dr. Collins Scotland who had seen her before.  She was stabilized there in  the office and sent to the emergency room for further evaluation.  The  patient had given a history of two previous heart attacks about 10 years  ago diagnosed by Dr. Meryl Crutch.  She had also been told of high  cholesterol in the past.  She has had a normal stress Cardiolite in our  office February 2006 and she had been admitted here in August 2006 and  ruled out for myocardial infarction at that time.  She has been under a  lot of personal and family stress.  She smokes about five cigarettes a  day.  She has a strongly positive family history for coronary disease.  Physical examination on admission is normal except for resolving skin  lesions of the leg.   Admission chest x-ray and CBC and EKG were normal.  Cardiac enzymes were  negative.  On the day after admission the patient was taken to the cath  lab by Dr. Lady Deutscher and underwent diagnostic left heart cath.  She  was found to have normal coronary arteries.  She had normal left  ventricular function with an ejection fraction of 55-60%.  There were no  wall motion abnormalities to suggest previous myocardial infarction.   It was felt that she should continue to be treated with aggressive risk  factor modification because of her family history of premature coronary  disease.  She will remain on her same home medications and to be  rechecked in the office in about 1 month.  Condition on discharge is  improved.           ______________________________  Cassell Clement, M.D.     TB/MEDQ  D:  11/27/2006  T:  11/27/2006  Job:  [id number]   cc:   Tammy R. Collins Scotland, M.D.  Elmore Guise., M.D.

## 2011-05-16 NOTE — H&P (Signed)
NAME:  Phyllis Garza, Phyllis Garza NO.:  [id number]   MEDICAL RECORD NO.:  [id number]          PATIENT TYPE:  INP   LOCATION:  0105                         [facility]ACILITY:  [facility]   PHYSICIAN:  Jackie Plum, M.D.DATE OF BIRTH:  1966-07-31   DATE OF ADMISSION:  01/15/2005  DATE OF DISCHARGE:                                HISTORY & PHYSICAL   CHIEF COMPLAINT:  Syncope today.   HISTORY OF PRESENT ILLNESS:  The patient presents with a history of passing  out sometime this afternoon while she was getting ready to go to work.  History was given by the patient and her friend, Efraim Kaufmann, in the ER.  According to the patient, she was getting ready to go to work and was  waiting for her friend, Efraim Kaufmann, who is a girlfriend of her son to come and  babysit for her.  While talking on the phone with [facility], she suddenly felt  very hot, and when she woke up she was on the floor with Melissa shaking her  to wake up.  She denies any antecedent chest pain or shortness of breath.  She denies any palpitations prior to the event.  She denies any nausea or  vomiting.  However, she felt a bit nauseated later on when she came to the  hospital emergency room.  No diarrhea, abdominal pain, cough, fever, chills,  or sputum production.  She denies any history of dysuria or frequency of  micturition.  The patient went to see her primary care physician, Dr. Earlene Plater,  at the urgent care, who referred her to the emergency room.  According to  the patient, at the urgent care, her BP  was abnormal.  She does have some  difficulties standing up because of dizziness.  Of note, Melissa, the  patient's friend, also mentions that when she went to the house, she found  her friend on the floor unconscious and unresponsive.  She was ghostly  white.  She was clammy and diaphoretic.  The patient denies any recent  emesis.  She indicates that she has been a bit more tired than usual  recently.   PAST MEDICAL HISTORY:  1.   Positive for history of heart attack in 1989.  2.  History of hypercholesterolemia.  She indicates that her doctor later on      told her her cholesterol was fine, and has not been on cholesterol      medicine since then.  3.  History of bipolar disorder.  4.  Anxiety disorder.  5.  She denies any history of hypertension or diabetes.   FAMILY HISTORY:  Her father died of a heart attack at the age of 92 years.  Before the last heart attack in 1977, the father had had 2 other episodes of  heart attack.   ALLERGIES:  PENICILLIN.   CURRENT MEDICATIONS:  1.  Depakote 500 mg tablets, 2 tablets at night.  2.  Seroquel 100 mg tablets, 1-2 tablets at night.  3.  Xanax p.r.n.   SOCIAL HISTORY:  The patient is separated.  She has 2 boys, aged 44 and  44  years of age.  She smokes a half a pack of cigarettes daily.  She does not  drink alcohol or illicit drugs.   REVIEW OF SYSTEMS:  Significant positives and negatives are as noted in the  history of present illness.  .   PHYSICAL EXAMINATION:  VITAL SIGNS:  Blood pressure was 109/79, pulse 82,  respirations 20, temperature 98.8 degrees Fahrenheit.  O2 saturation of 96%  on room air.  GENERAL:  No acute cardiopulmonary distress.  Marland Kitchen  HEENT:  Normocephalic and atraumatic.  Pupils equal, round and reactive to  light.  Extraocular movements intact.  Oropharynx was moist without exudate  or erythema.  LUNGS:  Clear to auscultation.  CARDIAC:  Regular rate and rhythm.  No gallops or murmurs.  ABDOMEN:  Soft, nontender.  EXTREMITIES:  No cyanosis, clubbing, or edema.  CNS:  Alert and oriented x3.  No neural deficits.   Chest x-ray with no acute infiltrate. EKG revealed normal sinus rhythm with  nonspecific ST wave changes.  WBC count 8.6, hemoglobin 15.3, hematocrit  45.9, MCV 87.6.  Sodium 138, potassium 3.7, chloride 109, CO2 of 25, glucose  96, BUN 7, creatinine 0.7, calcium 9.1.  Point of care cardiac markers with  myoglobin 29.3, CK-MB  less than 1.0, troponin I less than 0.05.   IMPRESSION:  Syncope; etiology unclear in a patient with a very significant  cardiac history.  The patient's glucose was 86 mg/dl in the ER.  She also  indicates that her glucose level was normal at the urgent care.   PLAN:  The patient will be admitted to the hospital on telemetry monitoring.  Will cycle her enzymes, and get a 2-D echocardiogram.  Fasting lipid in the  morning.  The ED physician, Dr. Maryanna Shape, called Dr. Patty Sermons, who is  on call for unassigned, and he asked [place] to admit, and to consult as needed.  We called back to talk to him; however,  he is currently off at the moment, and we spoke to Dr. Waldon Reining, who is on  call for Dr. Patty Sermons, and he is going to relay the consult to him, and  possibly she will be seen in the morning or some time tonight.  Further  recommendations will be made as the data base expands.     Geor   GO/MEDQ  D:  01/15/2005  T:  01/15/2005  Job:  [id number]   cc:   Ulyses Amor, MD  [address]  [place], [place] [address]  Fax: [phone]   Cassell Clement, M.D.  [address]  [place]  [place] [address]  Fax: [phone]   Louanna Raw  [address]  [place]  [place] [address]  Fax: [phone]

## 2011-05-16 NOTE — H&P (Signed)
NAME:  Phyllis Garza, Phyllis Garza               ACCOUNT NO.:  [id number]   MEDICAL RECORD NO.:  [id number]          PATIENT TYPE:  INP   LOCATION:  1826                         FACILITY:  [facility]   PHYSICIAN:  Corinna L. Lendell Caprice, MDDATE OF BIRTH:  October 23, 1966   DATE OF ADMISSION:  07/20/2006  DATE OF DISCHARGE:                                HISTORY & PHYSICAL   The patient's PCP is Dr. Bayard Beaver. Spear.   CHIEF COMPLAINT:  Left leg abscess.   HISTORY OF PRESENT ILLNESS:  The patient is a 45 year old white female with  a past medical history of MI times two, secondary to defective heart valve,  as well as bipolar disorder, who 24 to 36 hours prior to this admission, was  seen in Urgent Care after she had a problem with an infection of her leg.  Apparently the patient had been having problems with skin bumps and in-grown  hairs on her left leg, and while shaving tried to shave it off and ended up  getting a small wound.  This wound started to spread in nature with  surrounding redness and pain, so she went to Urgent Care to have it treated.  At the time we suspected that she had a small community acquired abscess and  was put on p.o. doxycycline.  She was told to return back to the emergency  room if she had any further problems.  The patient continued to have severe  pain and actually, the size of the wound itself, compared to previous  accounts, had near doubled in size to close to an inch and a half in length  now.  There was concern the patient had failed outpatient therapy and the  attending wanted to put her on IV vancomycin.  He called the hospitalist for  further admission, as Dr. Collins Scotland no longer has a contract with [facility]  hospitalists solely.  The patient was being started on IV vancomycin in the  emergency room when she started having severe erythematous rash starting  from the top of her head down to her abdomen, with visible hives and  pruritus.  The vancomycin was stopped and the  patient was given IV Benadryl.  She complains of feeling quite anxious, especially after this allergy  reaction, and complains of some pain in her left leg.  She denies any  headaches, vision changes, dysphagia, chest pain, palpitations, shortness of  breath, wheeze, cough, abdominal pain, hematuria, dysuria, constipation,  diarrhea, or any other focal extremity numbness or weakness.   REVIEW OF SYSTEMS:  Otherwise negative.   PAST MEDICAL HISTORY:  The patient's past medical history includes CAD with  MI times two, reportedly secondary to her heart valve condition.  She also  has a history of high blood pressure and bipolar disorder.   MEDICATIONS:  1.  Doxycycline 100 mg p.o. b.i.d., times 10 days, which was just started      one day prior to coming in.  2.  Zoloft 25 p.o. q.h.s.  3.  Wellbutrin 150 p.o. daily.  4.  Toprol-XL, which she said she takes inconsistently because at times  her      blood pressure drops too low.  5.  Lamictal 150 p.o. b.i.d.  6.  Klonopin 0.25 p.o. every eight hours p.r.n.  7.  HCTZ 25 p.o. daily.  8.  Digoxin 0.125 p.o. daily.  9.  Aspirin 81 p.o. daily.   ALLERGIES:  The patient has allergies to CODEINE, PENICILLIN and no  VANCOMYCIN.   SOCIAL HISTORY:  She smokes a half a pack per day.  No alcohol or drug use.   FAMILY HISTORY:  Noncontributory.   PHYSICAL EXAMINATION:  VITAL SIGNS ON ADMISSION:  Temperature 98.2.  Heart  rate 103, now down to 88.  Blood pressure 134/99.  Respirations 22.  O2 sat  98% on room air.  GENERAL:  The patient is alert and oriented times three, in no apparent  distress.  HEENT:  Normocephalic and atraumatic.  Mucous membranes are moist.  She has  no carotid bruits.  HEART:  Regular rate and rhythm, S1, S2 with a small 2/6 systolic ejection  murmur.  LUNGS:  Clear to auscultation bilaterally.  ABDOMEN:  Soft, nontender, nondistended, positive bowel sounds.  EXTREMITIES:  Show no clubbing or cyanosis.  Her left leg is  wrapped, but  has some surrounding erythema and is tender.  I have not adjusted her  drainage.  No pitting edema.   LAB WORK:  White count 9.9, H&H 14.8 and 43.9, MCV of 89, platelet count  184,000.  No shift.   Digoxin level is low at 0.3.   Sodium 137, potassium 3.6, chloride 105, bicarb 22, BUN 6, creatinine 0.9,  glucose 97.   ASSESSMENT AND PLAN:  1.  MRSA abscess.  She has probably been too recent to consider this a      failed outpatient therapy, but on the other hand, if the length of the      wound has changed in a sort period of time, would agree to start IV      antibiotics initially and then see if we can transition this over to      p.o. antibiotics.  We will have a PICC line placed and start the patient      no IV doxycycline.  2.  Bipolar disorder.  Continue Lamictal, Wellbutrin and Zoloft.  3.  Heart condition, with previous history of MI times two.  Continue      aspirin, digoxin and HCTZ.  Note, the patient's digoxin level is      subtherapeutic.  She does have a history of medical noncompliance.  4.  History of medical noncompliance.  5.  Tobacco abuse.  I have offered her nicotine patch as needed.      Hollice Espy, M.D.   Electronically Signed     ______________________________  Hedy Camara. Lendell Caprice, MD    SKK/MEDQ  D:  07/20/2006  T:  07/20/2006  Job:  [id number]   cc:   Tammy R. Collins Scotland, M.D.

## 2011-09-24 LAB — POCT I-STAT, CHEM 8
BUN: 7
Calcium, Ion: 1.11 — ABNORMAL LOW
Chloride: 105
Creatinine, Ser: 0.8
Glucose, Bld: 97
HCT: 46
Hemoglobin: 15.6 — ABNORMAL HIGH
Potassium: 3.9
Sodium: 140
TCO2: 23

## 2011-09-24 LAB — D-DIMER, QUANTITATIVE: D-Dimer, Quant: 0.77 — ABNORMAL HIGH

## 2011-09-24 LAB — RAPID URINE DRUG SCREEN, HOSP PERFORMED
Amphetamines: NOT DETECTED
Barbiturates: NOT DETECTED
Benzodiazepines: NOT DETECTED
Tetrahydrocannabinol: NOT DETECTED

## 2011-09-24 LAB — HEPATIC FUNCTION PANEL
Albumin: 3.7
Indirect Bilirubin: 0.4
Total Protein: 6.4

## 2011-09-24 LAB — POCT CARDIAC MARKERS
CKMB, poc: <1 — ABNORMAL LOW
CKMB, poc: <1 — ABNORMAL LOW
CKMB, poc: <1 — ABNORMAL LOW
Myoglobin, poc: 31.3
Myoglobin, poc: 34.3
Operator id: 196461
Operator id: 285491
Troponin i, poc: <0.05
Troponin i, poc: <0.05
Troponin i, poc: <0.05

## 2011-09-24 LAB — LIPASE, BLOOD: Lipase: 15

## 2012-01-07 ENCOUNTER — Telehealth: Payer: Self-pay | Admitting: Gastroenterology

## 2012-01-07 NOTE — Telephone Encounter (Signed)
Patient states she has been to her PCP for constipation and bloating which has continued since her visit here last year. She states her PCP suggested she get a "blood test for h. Pylori" and she is calling to see about this. She states she is unable to have a bowel movement without using an enema. She has difficulty eating due to bloating and constipation which is causing dehydration and she states her weight is down from 158 lbs to 124 lbs. She is hungry but feels bad when she tries to eat. PCP did a celiac test which she states was negative. She has tried Miralax, Lactulose and Dulcolax without results. She tried coming off her Lamictal and that did not help either. She states her current medications are Lamictal, HCTZ , Klonipin prn, Blood pressure medication that she does not know the name of, Metamucil and Dulcolax. Offered patient an OV with Dr. Arlyce Dice since it has been a year since last visit but she wants to know if Dr. Arlyce Dice thinks he can help her before she schedules. Last ECOL 1/12. EGD- normal, Colon- hyperplastic polyp. Please, advise.

## 2012-01-08 NOTE — Telephone Encounter (Signed)
I think we can help her. Please schedule an office visit.

## 2012-01-08 NOTE — Telephone Encounter (Signed)
Spoke with patient and scheduled her on 01/12/12 at 9:15 AM with Dr. Arlyce Dice.

## 2012-01-12 ENCOUNTER — Encounter: Payer: Self-pay | Admitting: Gastroenterology

## 2012-01-12 ENCOUNTER — Ambulatory Visit (INDEPENDENT_AMBULATORY_CARE_PROVIDER_SITE_OTHER): Payer: 59 | Admitting: Gastroenterology

## 2012-01-12 DIAGNOSIS — K5900 Constipation, unspecified: Secondary | ICD-10-CM | POA: Insufficient documentation

## 2012-01-12 DIAGNOSIS — Z86010 Personal history of colonic polyps: Secondary | ICD-10-CM

## 2012-01-12 NOTE — Assessment & Plan Note (Addendum)
Patient has chronic idiopathic constipation. Workup including colonoscopy in January, 2012 demonstrated hyperplastic polyps only.  Recommendations #1 trial of  linaclotide 145 micrograms daily

## 2012-01-12 NOTE — Patient Instructions (Addendum)
You have been given a separate informational sheet regarding your tobacco use, the importance of quitting and local resources to help you quit.  We have given you samples of Linzess for constipation Please make a follow up appointment in 1 month

## 2012-01-12 NOTE — Progress Notes (Signed)
History of Present Illness:  Phyllis Garza has returned for followup constipation. This remains a problem. Without an enema she may go one to 2 weeks without a bowel movement.  With constipation she gets bloating and some abdominal discomfort. Colonoscopy in January, 2012 showed hyperplastic polyps only.     Past Medical History  Diagnosis Date  . Anxiety   . Depression   . Colon polyps     hyperplastic  . Bipolar 2 disorder    Past Surgical History  Procedure Date  . Cesarean section     x2  . Abdominal hysterectomy     2010  . Ankle pin     plate and 6 screws; right  . Hernia repair    family history includes Asthma in her maternal grandmother and mother; Breast cancer in her sister; Colon polyps in her brother; Heart disease in her father and mother; Mental illness in her paternal grandmother; and Stroke in her maternal grandmother.  There is no history of Colon cancer. Current Outpatient Prescriptions  Medication Sig Dispense Refill  . citalopram (CELEXA) 20 MG tablet Take 20 mg by mouth daily.      . clonazePAM (KLONOPIN) 0.5 MG tablet Take 0.5 mg by mouth 2 (two) times daily as needed.      Marland Kitchen glycerin adult (GLYCERIN ADULT) 2 G SUPP Place 1 suppository rectally once as needed.      . lamoTRIgine (LAMICTAL) 25 MG tablet Take 25 mg by mouth daily.      . mineral oil enema Place 1 enema rectally once as needed.      . Sodium Phosphates (RA SALINE ENEMA RE) Place rectally. As needed       Allergies as of 01/12/2012 - Review Complete 01/12/2012  Allergen Reaction Noted  . Codeine sulfate    . Decongestant Hives 01/12/2012  . Erythromycin base    . Oxycodone-acetaminophen    . Penicillins      reports that she has been smoking Cigarettes.  She has never used smokeless tobacco. She reports that she drinks alcohol. She reports that she does not use illicit drugs.     Review of Systems: Pertinent positive and negative review of systems were noted in the above HPI section.  All other review of systems were otherwise negative.  Vital signs were reviewed in today's medical record Physical Exam: General: Well developed , well nourished, no acute distress Head: Normocephalic and atraumatic Eyes:  sclerae anicteric, EOMI Ears: Normal auditory acuity Mouth: No deformity or lesions Neck: Supple, no masses or thyromegaly Lungs: Clear throughout to auscultation Heart: Regular rate and rhythm; no murmurs, rubs or bruits Abdomen: Soft,and non distended. There is mild left periumbilical tenderness to palpation No masses, hepatosplenomegaly or hernias noted. Normal Bowel sounds Rectal:deferred Musculoskeletal: Symmetrical with no gross deformities  Skin: No lesions on visible extremities Pulses:  Normal pulses noted Extremities: No clubbing, cyanosis, edema or deformities noted Neurological: Alert oriented x 4, grossly nonfocal Cervical Nodes:  No significant cervical adenopathy Inguinal Nodes: No significant inguinal adenopathy Psychological:  Alert and cooperative. Normal mood and affect

## 2012-01-20 ENCOUNTER — Telehealth: Payer: Self-pay | Admitting: Gastroenterology

## 2012-01-20 MED ORDER — LINACLOTIDE 145 MCG PO CAPS: 145.0000 ug | ORAL_CAPSULE | ORAL | Status: DC

## 2012-01-20 NOTE — Telephone Encounter (Signed)
Dr Arlyce Dice, [place] Childes- You gave this pt Linzess samples when she was here... I just called her in a prescription her insurance will cover all but 50$  She loves the medication, she stated she has never took anything that has helped her so much.

## 2012-03-16 ENCOUNTER — Telehealth: Payer: Self-pay | Admitting: Gastroenterology

## 2012-03-16 NOTE — Telephone Encounter (Signed)
Spoke with patient and gave her Dr. Kaplan's recommendation. 

## 2012-03-16 NOTE — Telephone Encounter (Signed)
Pt states she has been taking Linzess to help with her constipation. States she saw her PCP and had an xray 03/09/12 and that her PCP told her she was very constipated. Report is in epic. Pt states she took the linzess and on wed she had 3 black liquid stools, Thursday she had 2 black liquid stools. States she has taken the pill everyday but she has now not had a bowel movement since Thursday. Pt wants to know if this is normal and is it safe for her to continue taking the linzess if she isn't having a bowel movement. Please advise.

## 2012-03-16 NOTE — Telephone Encounter (Signed)
She should continue Linzess and call back in the end of the week to report her progress.

## 2012-03-19 ENCOUNTER — Telehealth: Payer: Self-pay | Admitting: Gastroenterology

## 2012-03-19 NOTE — Telephone Encounter (Signed)
Pt states that she was told to call back later in the week for an update on the Linzess. Pt states on Tuesday she had 2 black liquid stools, pt describes them as "black paint." Wed she states she had 1 black liquid stool. She states that yesterday and today she took the Linzess and did not have a bowel movement. Pt states that on the days she takes the Linzess and has a bowel movement she can feel her stomach rumbling. On the days she takes it and does not have a bowel movement she had a stomach ache all day. She is on the dose. Dr. Arlyce Dice please advise.

## 2012-03-19 NOTE — Telephone Encounter (Signed)
Please send a Rx for Linzess QD.  She should take qd and f/u in 3-4 weeks

## 2012-03-19 NOTE — Telephone Encounter (Signed)
Pt aware and states she has enough of the linzess. Pt scheduled to see Dr. Arlyce Dice 04/12/12@10 :30am.

## 2012-04-12 ENCOUNTER — Ambulatory Visit (INDEPENDENT_AMBULATORY_CARE_PROVIDER_SITE_OTHER): Payer: 59 | Admitting: Gastroenterology

## 2012-04-12 ENCOUNTER — Encounter: Payer: Self-pay | Admitting: Gastroenterology

## 2012-04-12 VITALS — BP 88/74 | HR 88 | Ht 65.0 in | Wt 132.0 lb

## 2012-04-12 DIAGNOSIS — K5900 Constipation, unspecified: Secondary | ICD-10-CM

## 2012-04-12 NOTE — Assessment & Plan Note (Addendum)
Patient has chronic idiopathic constipation. Workup including colonoscopy in January, 2012 demonstrated hyperplastic polyps only.  She's had an incomplete response to linaclotide   Recommendations #1 increase  linaclotide  to 290 micrograms daily; if not improved or she does not tolerate this I will try [place]

## 2012-04-12 NOTE — Progress Notes (Signed)
History of Present Illness:  Phyllis Garza has returned for followup of constipation. On Linzess 145 mcg daily she is moving her bowels about once a week. Bowels are watery and with urgency. She has mild discomfort prior to her bowel movement. She reports that this is an improvement from her baseline.  Hyperplastic polyps were removed in 2012.    Review of Systems: Pertinent positive and negative review of systems were noted in the above HPI section. All other review of systems were otherwise negative.    Current Medications, Allergies, Past Medical History, Past Surgical History, Family History and Social History were reviewed in [employer] electronic medical record  Vital signs were reviewed in today's medical record. Physical Exam: General: Well developed , well nourished, no acute distress

## 2012-04-12 NOTE — Patient Instructions (Addendum)
You have been given a separate informational sheet regarding your tobacco use, the importance of quitting and local resources to help you quit. Follow up in 6 weeks

## 2012-04-21 ENCOUNTER — Emergency Department (HOSPITAL_COMMUNITY)
Admission: EM | Admit: 2012-04-21 | Discharge: 2012-04-21 | Disposition: A | Discharge status: Home / Self Care | Payer: 59 | Attending: Emergency Medicine | Admitting: Emergency Medicine

## 2012-04-21 ENCOUNTER — Encounter (HOSPITAL_COMMUNITY): Payer: Self-pay | Admitting: *Deleted

## 2012-04-21 DIAGNOSIS — R109 Unspecified abdominal pain: Secondary | ICD-10-CM | POA: Insufficient documentation

## 2012-04-21 DIAGNOSIS — F411 Generalized anxiety disorder: Secondary | ICD-10-CM | POA: Insufficient documentation

## 2012-04-21 DIAGNOSIS — F3189 Other bipolar disorder: Secondary | ICD-10-CM | POA: Insufficient documentation

## 2012-04-21 DIAGNOSIS — R197 Diarrhea, unspecified: Secondary | ICD-10-CM | POA: Insufficient documentation

## 2012-04-21 DIAGNOSIS — R112 Nausea with vomiting, unspecified: Secondary | ICD-10-CM

## 2012-04-21 MED ORDER — DICYCLOMINE HCL 20 MG PO TABS: 20.0000 mg | ORAL_TABLET | Freq: Two times a day (BID) | ORAL | Status: DC

## 2012-04-21 MED ORDER — ONDANSETRON 4 MG PO TBDP: 4.0000 mg | ORAL_TABLET | Freq: Three times a day (TID) | ORAL | Status: AC | PRN

## 2012-04-21 MED ORDER — ONDANSETRON HCL 4 MG/2ML IJ SOLN
4.0000 mg | Freq: Once | INTRAMUSCULAR | Status: AC
  Administered 2012-04-21: 4 mg via INTRAVENOUS
  Filled 2012-04-21: qty 2

## 2012-04-21 MED ORDER — SODIUM CHLORIDE 0.9 % IV BOLUS (SEPSIS)
1000.0000 mL | Freq: Once | INTRAVENOUS | Status: AC
  Administered 2012-04-21: 1000 mL via INTRAVENOUS

## 2012-04-21 MED ORDER — ONDANSETRON HCL 4 MG/2ML IJ SOLN
4.0000 mg | Freq: Once | INTRAMUSCULAR | Status: AC
  Administered 2012-04-21: 4 mg via INTRAVENOUS

## 2012-04-21 MED ORDER — ONDANSETRON HCL 4 MG/2ML IJ SOLN
INTRAMUSCULAR | Status: AC
  Administered 2012-04-21: 4 mg via INTRAVENOUS
  Filled 2012-04-21: qty 2

## 2012-04-21 MED ORDER — KETOROLAC TROMETHAMINE 30 MG/ML IJ SOLN
30.0000 mg | Freq: Once | INTRAMUSCULAR | Status: AC
  Administered 2012-04-21: 30 mg via INTRAVENOUS
  Filled 2012-04-21: qty 1

## 2012-04-21 MED ORDER — PROMETHAZINE HCL 25 MG PO TABS: 25.0000 mg | ORAL_TABLET | Freq: Four times a day (QID) | ORAL | Status: DC | PRN

## 2012-04-21 MED ORDER — ONDANSETRON HCL 4 MG/2ML IJ SOLN
INTRAMUSCULAR | Status: AC
  Filled 2012-04-21: qty 2

## 2012-04-21 NOTE — ED Provider Notes (Signed)
History     CSN: [id number]  Arrival date & time 04/21/12  0202   First MD Initiated Contact with Patient 04/21/12 0210      Chief Complaint  Patient presents with  . GI Problem    (Consider location/radiation/quality/duration/timing/severity/associated sxs/prior treatment) HPI Comments: 46 year old female with history of bipolar disorder and anxiety who presents with the complaint of abdominal cramping with nausea vomiting and diarrhea. According to the patient and the EMS personnel the patient has had approximately 8 hours of ongoing upper abdominal cramping, watery diarrhea and multiple episodes of vomiting. The symptoms are persistent, moderate, nothing makes it better or worse. She denies associated fevers but admits to having a child with the same symptoms for the last 3 days. She was given one dose of Zofran and 500 cc of IV fluids in the ambulance with minimal improvement.    She denies chest pain, back pain, shortness of breath, headache, swelling, rash. The diarrhea is watery and nonbloody  Patient is a 45 y.o. female presenting with GI illlness. The history is provided by the patient, the spouse and the EMS personnel.  GI Problem     Past Medical History  Diagnosis Date  . Anxiety   . Depression   . Colon polyps     hyperplastic  . Bipolar 2 disorder     Past Surgical History  Procedure Date  . Cesarean section     x2  . Abdominal hysterectomy     2010  . Ankle pin     plate and 6 screws; right  . Hernia repair   . Cardiac catheterization     Family History  Problem Relation Age of Onset  . Heart disease Mother     died age 57  . Heart disease Father     died age 85  . Breast cancer Sister   . Stroke Maternal Grandmother   . Asthma Mother   . Colon polyps Brother     x 2  . Asthma Maternal Grandmother   . Mental illness Paternal Grandmother   . Colon cancer Neg Hx     History  Substance Use Topics  . Smoking status: Current Everyday Smoker   Types: Cigarettes  . Smokeless tobacco: Never Used  . Alcohol Use: Yes    OB History    Grav Para Term Preterm Abortions TAB SAB Ect Mult Living                  Review of Systems  All other systems reviewed and are negative.    Allergies  Azithromycin; Penicillins; Codeine sulfate; Decongestant; Erythromycin base; and Oxycodone-acetaminophen  Home Medications   Current Outpatient Rx  Name Route Sig Dispense Refill  . AMLODIPINE BESYLATE 5 MG PO TABS Oral Take 1 tablet by mouth Daily.    Marland Kitchen CITALOPRAM HYDROBROMIDE 20 MG PO TABS Oral Take 20 mg by mouth daily.    Marland Kitchen CLONAZEPAM 0.5 MG PO TABS Oral Take 0.5 mg by mouth 3 (three) times daily as needed. Anxiety or panic attack     . HYDROCHLOROTHIAZIDE 12.5 MG PO TABS Oral Take 1 tablet by mouth Daily.    Marland Kitchen LAMOTRIGINE 25 MG PO TABS Oral Take 25 mg by mouth daily.    Marland Kitchen LINACLOTIDE 145 MCG PO CAPS Oral Take 145 mcg by mouth 1 day or 1 dose. 30 capsule 3  . DICYCLOMINE HCL 20 MG PO TABS Oral Take 1 tablet (20 mg total) by mouth 2 (two) times daily. 20  tablet 0  . ONDANSETRON 4 MG PO TBDP Oral Take 1 tablet (4 mg total) by mouth every 8 (eight) hours as needed for nausea. 10 tablet 0  . PROMETHAZINE HCL 25 MG PO TABS Oral Take 1 tablet (25 mg total) by mouth every 6 (six) hours as needed for nausea. 12 tablet 0    BP 113/70  Pulse 88  Temp(Src) 99 F (37.2 C) (Oral)  Resp 20  SpO2 99%  Physical Exam  Nursing note and vitals reviewed. Constitutional: She appears well-developed and well-nourished. No distress.  HENT:  Head: Normocephalic and atraumatic.  Mouth/Throat: Oropharynx is clear and moist. No oropharyngeal exudate.  Eyes: Conjunctivae and EOM are normal. Pupils are equal, round, and reactive to light. Right eye exhibits no discharge. Left eye exhibits no discharge. No scleral icterus.  Neck: Normal range of motion. Neck supple. No JVD present. No thyromegaly present.  Cardiovascular: Normal rate, regular rhythm, normal  heart sounds and intact distal pulses.  Exam reveals no gallop and no friction rub.   No murmur heard. Pulmonary/Chest: Effort normal and breath sounds normal. No respiratory distress. She has no wheezes. She has no rales.  Abdominal: Soft. She exhibits no distension and no mass. There is tenderness ( Minimal epigastric tenderness, increased bowel sounds).  Musculoskeletal: Normal range of motion. She exhibits no edema and no tenderness.  Lymphadenopathy:    She has no cervical adenopathy.  Neurological: She is alert. Coordination normal.  Skin: Skin is warm and dry. No rash noted. No erythema.  Psychiatric: Her behavior is normal.       Anxious appearing    ED Course  Procedures (including critical care time)  Labs Reviewed - No data to display No results found.   1. Nausea vomiting and diarrhea       MDM  At this time the mucous membranes appear moist, the abdomen is minimally tender in the symptoms with nausea vomiting and watery diarrhea appeared consistent with a viral gastroenteritis especially in light of having family members with similar symptoms. Currently pulse is 96, respirations 18, sats 100%, afebrile, blood pressure 130/96. Repeat dose of antiemetics, IV fluids. The patient has declined pain medications at this time.  Patient has improved significantly after 2 doses of Zofran, Toradol and IV fluids. She appears hemodynamically stable, understands that she has. I encouraged her to followup should her symptoms worsen.  The patient has ambulated back-and-forth to the bathroom multiple times and appears well.  Discharge Prescriptions include:  #1 Zofran  #2 Phenergan  #3 Bentyl    Vida Roller, MD 04/21/12 [phone]

## 2012-04-21 NOTE — ED Notes (Signed)
Patient presents with N/V/D since 4pm

## 2012-04-21 NOTE — ED Notes (Signed)
Patient c/o N/V/D since 1600 yesterday.  Also states she has panic attacks and feels like she is having another one.  Per EMS she had one in transport.

## 2012-04-21 NOTE — ED Notes (Signed)
Rx given x3 D/c instructions reviewed w/ pt and family - pt and family deny any further questions or concerns at present.  

## 2012-04-21 NOTE — Discharge Instructions (Signed)
Please take the medication called Zofran or Phenergan for nausea, Bentyl for abdominal cramps, call your doctor in the morning for a followup in 2 days if no significant improvement. We have given you IV fluids and I recommended that you continue to drink plenty of fluids and follow the below diet. Return to the hospital for severe or worsening symptoms.  B.R.A.T. Diet Your doctor has recommended the B.R.A.T. diet for you or your child until the condition improves. This is often used to help control diarrhea and vomiting symptoms. If you or your child can tolerate clear liquids, you may have:  Bananas.   Rice.   Applesauce.   Toast (and other simple starches such as crackers, potatoes, noodles).  Be sure to avoid dairy products, meats, and fatty foods until symptoms are better. Fruit juices such as apple, grape, and prune juice can make diarrhea worse. Avoid these. Continue this diet for 2 days or as instructed by your caregiver. Document Released: 12/15/2005 Document Revised: 12/04/2011 Document Reviewed: 06/03/2007 [facility] Patient Information 2012 [place], [place].B.R.A.T. Diet Your doctor has recommended the B.R.A.T. diet for you or your child until the condition improves. This is often used to help control diarrhea and vomiting symptoms. If you or your child can tolerate clear liquids, you may have:  Bananas.   Rice.   Applesauce.   Toast (and other simple starches such as crackers, potatoes, noodles).  Be sure to avoid dairy products, meats, and fatty foods until symptoms are better. Fruit juices such as apple, grape, and prune juice can make diarrhea worse. Avoid these. Continue this diet for 2 days or as instructed by your caregiver. Document Released: 12/15/2005 Document Revised: 12/04/2011 Document Reviewed: 06/03/2007 [facility] Patient Information 2012 [place], [place].

## 2012-05-04 ENCOUNTER — Telehealth: Payer: Self-pay | Admitting: Gastroenterology

## 2012-05-04 MED ORDER — LUBIPROSTONE 24 MCG PO CAPS: 24.0000 ug | ORAL_CAPSULE | Freq: Two times a day (BID) | ORAL | Status: AC

## 2012-05-04 NOTE — Telephone Encounter (Signed)
Pt aware and rx sent to pharmacy. 

## 2012-05-04 NOTE — Telephone Encounter (Signed)
Pt states that she recently had the noro virus and she did not have any trouble going to the bathroom. She states that the linzess however did not make her go to the bathroom. She was even taking it twice daily. Now that she is over the virus she started taking the linzess again and states that she still has not had a BM. Pt states she only has one pill left and she wanted to call and see if Dr. Arlyce Dice wanted to do something different. Please advise.

## 2012-05-04 NOTE — Telephone Encounter (Signed)
D/c linzess and begin amitize bid. C/b 1 week

## 2012-05-25 ENCOUNTER — Ambulatory Visit: Payer: 59 | Admitting: Gastroenterology

## 2012-07-05 ENCOUNTER — Ambulatory Visit: Payer: 59 | Admitting: Gastroenterology

## 2013-02-21 ENCOUNTER — Telehealth: Payer: Self-pay | Admitting: Gastroenterology

## 2013-02-21 NOTE — Telephone Encounter (Signed)
Pt is very concerned because she states her breath, mouth smells and tastes like "poop." Pt states she did an enema last week but today she has a lot of pressure right above her belly button and she is afraid she may have something blocked causing her to "taste stool." Pt states she has tried linzess and amitiza and nothing works. Dr. Arlyce Dice please advise.

## 2013-02-21 NOTE — Telephone Encounter (Signed)
I'm going to ask research to contact this patient for consideration of a constipation trial

## 2013-02-21 NOTE — Telephone Encounter (Signed)
Pt aware. Spoke with Darvin Neighbours in [facility] and let her know about referral.

## 2013-04-20 ENCOUNTER — Emergency Department (HOSPITAL_COMMUNITY)
Admission: EM | Admit: 2013-04-20 | Discharge: 2013-04-20 | Discharge status: Against Medical Advice | Payer: 59 | Attending: Emergency Medicine | Admitting: Emergency Medicine

## 2013-04-20 ENCOUNTER — Encounter (HOSPITAL_COMMUNITY): Payer: Self-pay | Admitting: Emergency Medicine

## 2013-04-20 DIAGNOSIS — R110 Nausea: Secondary | ICD-10-CM | POA: Insufficient documentation

## 2013-04-20 DIAGNOSIS — F17200 Nicotine dependence, unspecified, uncomplicated: Secondary | ICD-10-CM | POA: Insufficient documentation

## 2013-04-20 DIAGNOSIS — R197 Diarrhea, unspecified: Secondary | ICD-10-CM | POA: Insufficient documentation

## 2013-04-20 DIAGNOSIS — R109 Unspecified abdominal pain: Secondary | ICD-10-CM | POA: Insufficient documentation

## 2013-04-20 LAB — COMPREHENSIVE METABOLIC PANEL
Albumin: 3.9 g/dL (ref 3.5–5.2)
Alkaline Phosphatase: 55 U/L (ref 39–117)
BUN: 6 mg/dL (ref 6–23)
Creatinine, Ser: 0.61 mg/dL (ref 0.50–1.10)
GFR calc Af Amer: >90 mL/min (ref >90)
Glucose, Bld: 93 mg/dL (ref 70–99)
Potassium: 3.6 mEq/L (ref 3.5–5.1)
Total Bilirubin: 0.6 mg/dL (ref 0.3–1.2)
Total Protein: 7 g/dL (ref 6.0–8.3)

## 2013-04-20 LAB — CBC WITH DIFFERENTIAL/PLATELET
Basophils Relative: 0 % (ref 0–1)
Eosinophils Absolute: 0 10*3/uL (ref 0.0–0.7)
HCT: 45.5 % (ref 36.0–46.0)
Hemoglobin: 15.5 g/dL — ABNORMAL HIGH (ref 12.0–15.0)
Lymphs Abs: 0.7 10*3/uL (ref 0.7–4.0)
MCH: 28.7 pg (ref 26.0–34.0)
MCHC: 34.1 g/dL (ref 30.0–36.0)
MCV: 84.3 fL (ref 78.0–100.0)
Monocytes Absolute: 0.5 10*3/uL (ref 0.1–1.0)
Monocytes Relative: 5 % (ref 3–12)
RBC: 5.4 MIL/uL — ABNORMAL HIGH (ref 3.87–5.11)

## 2013-04-20 LAB — LIPASE, BLOOD: Lipase: 13 U/L (ref 11–59)

## 2013-04-20 NOTE — ED Notes (Signed)
Pt c/o abd pain, nausea and diarrhea starting yesterday; pt sent for PCP for eval for possible virus

## 2013-05-09 ENCOUNTER — Ambulatory Visit (INDEPENDENT_AMBULATORY_CARE_PROVIDER_SITE_OTHER): Payer: 59 | Admitting: Gastroenterology

## 2013-05-09 ENCOUNTER — Emergency Department (HOSPITAL_COMMUNITY)
Admission: EM | Admit: 2013-05-09 | Discharge: 2013-05-09 | Disposition: A | Discharge status: Home / Self Care | Payer: 59 | Attending: Emergency Medicine | Admitting: Emergency Medicine

## 2013-05-09 ENCOUNTER — Encounter: Payer: Self-pay | Admitting: Gastroenterology

## 2013-05-09 ENCOUNTER — Telehealth: Payer: Self-pay | Admitting: Gastroenterology

## 2013-05-09 ENCOUNTER — Encounter (HOSPITAL_COMMUNITY): Payer: Self-pay | Admitting: Emergency Medicine

## 2013-05-09 VITALS — BP 140/82 | HR 88 | Ht 64.57 in | Wt 121.5 lb

## 2013-05-09 DIAGNOSIS — Z8719 Personal history of other diseases of the digestive system: Secondary | ICD-10-CM | POA: Insufficient documentation

## 2013-05-09 DIAGNOSIS — F3189 Other bipolar disorder: Secondary | ICD-10-CM | POA: Insufficient documentation

## 2013-05-09 DIAGNOSIS — K5900 Constipation, unspecified: Secondary | ICD-10-CM

## 2013-05-09 DIAGNOSIS — F17200 Nicotine dependence, unspecified, uncomplicated: Secondary | ICD-10-CM | POA: Insufficient documentation

## 2013-05-09 DIAGNOSIS — Z880 Allergy status to penicillin: Secondary | ICD-10-CM | POA: Insufficient documentation

## 2013-05-09 DIAGNOSIS — K5641 Fecal impaction: Secondary | ICD-10-CM

## 2013-05-09 DIAGNOSIS — F411 Generalized anxiety disorder: Secondary | ICD-10-CM | POA: Insufficient documentation

## 2013-05-09 DIAGNOSIS — Z79899 Other long term (current) drug therapy: Secondary | ICD-10-CM | POA: Insufficient documentation

## 2013-05-09 MED ORDER — HYDROCORTISONE 2.5 % RE CREA: TOPICAL_CREAM | RECTAL | Status: DC

## 2013-05-09 MED ORDER — LINACLOTIDE 290 MCG PO CAPS: 1.0000 | ORAL_CAPSULE | Freq: Every day | ORAL | Status: DC

## 2013-05-09 MED ORDER — ONDANSETRON 4 MG PO TBDP: 4.0000 mg | ORAL_TABLET | Freq: Three times a day (TID) | ORAL | Status: DC | PRN

## 2013-05-09 MED ORDER — LIDOCAINE HCL 2 % EX GEL
Freq: Once | CUTANEOUS | Status: AC
  Administered 2013-05-09: 15:00:00
  Filled 2013-05-09: qty 10

## 2013-05-09 MED ORDER — MINERAL OIL RE ENEM
1.0000 | ENEMA | Freq: Once | RECTAL | Status: AC
  Administered 2013-05-09: 1 via RECTAL
  Filled 2013-05-09: qty 1

## 2013-05-09 NOTE — ED Provider Notes (Signed)
Medical screening examination/treatment/procedure(s) were performed by non-physician practitioner and as supervising physician I was immediately available for consultation/collaboration.    Huel Centola R Maryjean Corpening, MD 05/09/13 2348 

## 2013-05-09 NOTE — Telephone Encounter (Signed)
Pt states she is severely constipated and needs something to help her. States she had some rectal bleeding. Pt scheduled to see Doug Sou PA today at 10am. Pt aware of appt date and time.

## 2013-05-09 NOTE — ED Provider Notes (Signed)
History     CSN: [id number]  Arrival date & time 05/09/13  1242   First MD Initiated Contact with Patient 05/09/13 1446      Chief Complaint  Patient presents with  . Fecal Impaction    (Consider location/radiation/quality/duration/timing/severity/associated sxs/prior treatment) HPI  47 year old female with history of anxiety and history of recurrent constipation presents for evaluations of constipation. Patient reports she has intermittent constipation ongoing for the past 3 years. She has tried numerous over-the-counter medication and prescribe medication for constipation in the past. She is currently on Linzess, magnesium citrate, and weekly enema.  She developed norovirus 3 days ago but since has been having worsening constipation.  She was seen at her gastro office today.  They attempt to perform manual disimpaction without success.  Pt was recommended to drink a bottle of magnesium citrate today for relief.  She was told to come to ER if unable to produce BM, for manual disimpaction.  Otherwise pt denies fever, chills, n/v, dysuria, or rash.  Has hx of external hemorrhoids.    Past Medical History  Diagnosis Date  . Anxiety   . Depression   . Colon polyps     hyperplastic  . Bipolar 2 disorder     Past Surgical History  Procedure Laterality Date  . Cesarean section      x2  . Abdominal hysterectomy      2010  . Ankle pin      plate and 6 screws; right  . Hernia repair    . Cardiac catheterization      Family History  Problem Relation Age of Onset  . Heart disease Mother     died age 20  . Heart disease Father     died age 29  . Breast cancer Sister   . Stroke Maternal Grandmother   . Asthma Mother   . Colon polyps Brother     x 2  . Asthma Maternal Grandmother   . Mental illness Paternal Grandmother   . Colon cancer Neg Hx     History  Substance Use Topics  . Smoking status: Current Every Day Smoker    Types: Cigarettes  . Smokeless tobacco: Never  Used  . Alcohol Use: Yes    OB History   Grav Para Term Preterm Abortions TAB SAB Ect Mult Living                  Review of Systems  Constitutional: Negative for fever.  Gastrointestinal: Positive for abdominal pain and rectal pain. Negative for nausea and vomiting.  Skin: Negative for wound.    Allergies  Azithromycin; Penicillins; Codeine sulfate; Erythromycin base; Oxycodone-acetaminophen; and Pseudoephedrine hcl er  Home Medications   Current Outpatient Rx  Name  Route  Sig  Dispense  Refill  . citalopram (CELEXA) 20 MG tablet   Oral   Take 20 mg by mouth daily.         . clonazePAM (KLONOPIN) 0.5 MG tablet   Oral   Take 0.5 mg by mouth 3 (three) times daily as needed. Anxiety or panic attack          . hydrocortisone (ANUSOL-HC) 2.5 % rectal cream      Use rectally  2-3 x daily   30 g   1   . lamoTRIgine (LAMICTAL) 25 MG tablet   Oral   Take 25 mg by mouth daily.         Marland Kitchen Linaclotide (LINZESS) 290 MCG CAPS   Oral  Take 1 capsule by mouth daily.   30 capsule   5   . magnesium citrate SOLN   Oral   Take 1 Bottle by mouth once.         . ondansetron (ZOFRAN ODT) 4 MG disintegrating tablet   Oral   Take 1 tablet (4 mg total) by mouth every 8 (eight) hours as needed for nausea.   30 tablet   0     BP 125/100  Pulse 113  Temp(Src) 98.4 F (36.9 C) (Oral)  Resp 20  SpO2 99%  Physical Exam  Nursing note and vitals reviewed. Constitutional: She is oriented to person, place, and time. She appears well-developed and well-nourished. She appears distressed (uncomfortable appearing).  HENT:  Head: Atraumatic.  Mouth/Throat: Oropharynx is clear and moist.  Eyes: Conjunctivae are normal.  Neck: Neck supple.  Abdominal: There is tenderness (LLQ tenderness on palpation without guarding or rebound tenderness, no hernia. ).  Genitourinary:  Chaperone present:  External hemorrhoids, non-thrombosed.    Neurological: She is alert and oriented  to person, place, and time.  Skin: Skin is warm. No rash noted.  Psychiatric: She has a normal mood and affect.    ED Course  Fecal disimpaction Date/Time: 05/09/2013 4:16 PM Performed by: Fayrene Helper Authorized by: Fayrene Helper Consent: Verbal consent obtained. Risks and benefits: risks, benefits and alternatives were discussed Consent given by: patient Patient understanding: patient states understanding of the procedure being performed Patient consent: the patient's understanding of the procedure matches consent given Patient identity confirmed: verbally with patient and arm band Time out: Immediately prior to procedure a "time out" was called to verify the correct patient, procedure, equipment, support staff and site/side marked as required. Local anesthesia used: yes Local anesthetic: topical anesthetic Anesthetic total: 10 ml Patient tolerance: Patient tolerated the procedure well with no immediate complications.   (including critical care time)    3:32 PM Pt presents for management of stool impaction, which is chronic in nature.  With chaperone present, i was able to perform manual disimpaction, removing ~20 small balls of hard stools.  Pt tolerates well. Will give fleet enema and will continue to monitor.    6:30 PM Pt felt much better, ready for discharge.  I recommend high fiber diet, using miralax and anusol cream prescribed by her GI specialist.  Return precaution discussed.  Recommend using donut hole pillow to relieve rectal pressure.  Recommend diet and exercise especially walking often.     Labs Reviewed - No data to display No results found.   1. Constipation   2. Fecal impaction       MDM  BP 139/94  Pulse 53  Temp(Src) 97.9 F (36.6 C) (Oral)  Resp 18  SpO2 93%         Fayrene Helper, PA-C 05/09/13 1833

## 2013-05-09 NOTE — Progress Notes (Signed)
I will send chart to GI research to determine whether she's a candidate for a constipation trial, Otherwise agree with assessment and plan

## 2013-05-09 NOTE — ED Notes (Signed)
Pt went to restroom, states had couple hard stools come out but now feels like she has another impaction, Greta Doom, PA made aware.

## 2013-05-09 NOTE — ED Notes (Signed)
Pt reports impaction x 3 days. Pt was seen at [facility] and "they tried to disimpact me and where unable to." Pt was told to come here to ED Pt reports to taking magnesium citrate and now having nausea.

## 2013-05-09 NOTE — Patient Instructions (Addendum)
You have been given a separate informational sheet regarding your tobacco use, the importance of quitting and local resources to help you quit.  You have been scheduled for an appointment with Dr. Romie Levee at [facility]. Your appointment is on___________  at___________ . Please arrive at  for registration. Make certain to bring a list of current medications, including any over the counter medications or vitamins. Also bring your co-pay if you have one as well as your insurance cards. [facility] is located at [address], [address]. Should you need to reschedule your appointment, please contact them at [phone].  We have sent the following medications to your pharmacy for you to pick up at your convenience: Ansuol cream, and Zofran and Linzess.  Use over the counter mag citrate at bedtime as needed until office visit with Dr. Romie Levee.

## 2013-05-09 NOTE — Progress Notes (Signed)
05/09/2013 Phyllis Garza [id number] 07/11/1966   History of Present Illness:  Patient is a 47 year old female who is a patient of Dr. Marzetta Board.  She has severe constipation and has tried several bowel regimens over the years, including amitiza and linzess 145 mcg.  Says that a lot of the medications make her sick or make her Bipolar disorder worse.  She comes in today because she is unable to move her bowels despite trying a couple of different enemas recently (says that they used a soap suds enemas and a homemade milk of molasses enema without relief).  She says that there is stool in her rectum that she can see and feel, but it will not come out.  Colonoscopy 12/2010 revealed only two hyperplastic colonic polyps.  Says that the constipation makes her nauseated and that all of this makes her have panic attacks.  Says that she was off of her psych medications for a year to see if it would help with her constipation, but says that there was no difference or improvement.  Current Medications, Allergies, Past Medical History, Past Surgical History, Family History and Social History were reviewed in [employer] record.   Physical Exam: BP 140/82  Pulse 88  Ht 5' 4.57" (1.64 m)  Wt 121 lb 8 oz (55.112 kg)  BMI 20.49 kg/m2 General: Well developed white female in no acute distress Head: Normocephalic and atraumatic Eyes:  sclerae anicteric, conjunctiva pink  Ears: Normal auditory acuity Lungs: Clear throughout to auscultation Heart: Regular rate and rhythm Abdomen: Soft, non-distended. No masses, no hepatomegaly.  BS present but quiet.  Lower abdominal TTP without R/R/G. Rectal: One non-inflamed external hemorrhoid noted.  DRE revealed hard solid stool in rectal vault, but could not remove it manually.  Brown stool on exam glove. Musculoskeletal: Symmetrical with no gross deformities  Extremities: No edema  Neurological: Alert oriented x 4, grossly nonfocal Psychological:   Alert and cooperative. Normal mood and affect  Assessment and Recommendations: -Severe constipation:  Will drink one or two bottles of magnesium citrate today.  Will refer to [facility], Dr. Maisie Fus, for possible Sitz marker study and other evaluation for colonic inertia.  She is asking for zofran for her nausea.  Will anusol cream to use prn.  We will call in a prescription for Linzess 290 mcg and she will decide if she would like to try it (says that the 145 mcg made her sick).

## 2013-05-09 NOTE — ED Notes (Signed)
Pt states the past 2 years the only way she's had a BM is by enema's, had norovirus 2 weeks ago and had black diarrhea, since then had been impacted, states has had the urge to go but just a small amount comes out, went to gastro MD today and was told to go home and drink a bottle of mag citrate which she did, states was told to come here if no results, states having rectal pain. States gastro MD tried to disimpact her but fingers weren't long enough. Pt anxious at this time, states has panic attacks and needs anxiety medication.

## 2013-05-10 ENCOUNTER — Telehealth: Payer: Self-pay | Admitting: Gastroenterology

## 2013-05-10 NOTE — Telephone Encounter (Signed)
Miralax 125gm in 32 oz gatorade.  Drink over 2 hours

## 2013-05-10 NOTE — Telephone Encounter (Signed)
Pt was seen by Doug Sou PA yesterday for severe constipation. She has been set up to see [facility] early June for evaluation. Pt was disimpacted yesterday in the ER. Pt wants to know what she needs to do in the meantime prior to being seen by [facility]. Please advise.

## 2013-05-10 NOTE — Telephone Encounter (Signed)
Spoke with pt and she is aware. Pt knows to keep her appt with [facility].

## 2013-05-19 ENCOUNTER — Ambulatory Visit: Payer: 59 | Admitting: Gastroenterology

## 2013-05-30 ENCOUNTER — Ambulatory Visit (INDEPENDENT_AMBULATORY_CARE_PROVIDER_SITE_OTHER): Payer: 59 | Admitting: General Surgery

## 2013-05-30 ENCOUNTER — Encounter (INDEPENDENT_AMBULATORY_CARE_PROVIDER_SITE_OTHER): Payer: Self-pay | Admitting: General Surgery

## 2013-05-30 VITALS — BP 122/74 | HR 62 | Temp 98.6°F | Resp 16 | Ht 64.0 in | Wt 120.6 lb

## 2013-05-30 DIAGNOSIS — K5900 Constipation, unspecified: Secondary | ICD-10-CM

## 2013-05-30 DIAGNOSIS — R110 Nausea: Secondary | ICD-10-CM

## 2013-05-30 MED ORDER — BARIUM SULFATE PO CAPS: 1.0000 | ORAL_CAPSULE | Freq: Once | ORAL | Status: DC

## 2013-05-30 MED ORDER — ONDANSETRON HCL 4 MG PO TABS: 4.0000 mg | ORAL_TABLET | Freq: Three times a day (TID) | ORAL | Status: DC | PRN

## 2013-05-30 NOTE — Patient Instructions (Addendum)
GETTING TO GOOD BOWEL HEALTH. Irregular bowel habits such as constipation can lead to many problems over time.  Having one soft bowel movement a day is the most important way to prevent further problems.  The anorectal canal is designed to handle stretching and feces to safely manage our ability to get rid of solid waste (feces, poop, stool) out of our body.  BUT, hard constipated stools can act like ripping concrete bricks causing inflamed hemorrhoids, anal fissures, abdominal pain and bloating.     The goal: ONE SOFT BOWEL MOVEMENT EVERY DAY TO EVERY OTHER DAY!  To have soft, regular bowel movements:    Drink at least 8 tall glasses of water a day.     Take plenty of fiber.  Fiber is the undigested part of plant food that passes into the colon, acting s "natures broom" to encourage bowel motility and movement.  Fiber can absorb and hold large amounts of water. This results in a larger, bulkier stool, which is soft and easier to pass. Work gradually over several weeks up to 6 servings a day of fiber (25g a day even more if needed) in the form of: o Vegetables -- Root (potatoes, carrots, turnips), leafy green (lettuce, salad greens, celery, spinach), or cooked high residue (cabbage, broccoli, etc) o Fruit -- Fresh (unpeeled skin & pulp), Dried (prunes, apricots, cherries, etc ),  or stewed ( applesauce)  o Whole grain breads, pasta, etc (whole wheat)  o Bran cereals    Bulking Agents -- This type of water-retaining fiber generally is easily obtained each day by one of the following:  o Psyllium bran -- The psyllium plant is remarkable because its ground seeds can retain so much water. This product is available as Metamucil, Konsyl, Effersyllium, Per Diem Fiber, or the less expensive generic preparation in drug and health food stores. Although labeled a laxative, it really is not a laxative.  o Methylcellulose -- This is another fiber derived from wood which also retains water. It is available as  Citrucel. o Polyethylene Glycol - and "artificial" fiber commonly called Miralax or Glycolax.  It is helpful for people with gassy or bloated feelings with regular fiber o Flax Seed - a less gassy fiber than psyllium   No reading or other relaxing activity while on the toilet. If bowel movements take longer than 5 minutes, you are too constipated   AVOID CONSTIPATION.  High fiber and water intake usually takes care of this.  Sometimes a laxative is needed to stimulate more frequent bowel movements, but    Laxatives are not a good long-term solution as it can wear the colon out. o Osmotics (Milk of Magnesia, Fleets phosphosoda, Magnesium citrate, MiraLax, GoLytely) are safer than  o Stimulants (Senokot, Castor Oil, Dulcolax, Ex Lax)    o Do not take laxatives for more than 7days in a row.    IF SEVERELY CONSTIPATED, try a Bowel Retraining Program: o Do not use laxatives.  o Eat a diet high in roughage, such as bran cereals and leafy vegetables.  o Drink six (6) ounces of prune or apricot juice each morning.  o Eat two (2) large servings of stewed fruit each day.  o Take one (1) heaping tablespoon of a psyllium-based bulking agent twice a day. Use sugar-free sweetener when possible to avoid excessive calories.  o Eat a normal breakfast.  o Set aside 15 minutes after breakfast to sit on the toilet, but do not strain to have a bowel movement.  o If you do not have a bowel movement by the third day, use an enema and repeat the above steps.   Fiber Chart  You should 25-30g of fiber per day and drinking 8 glasses of water to help your bowels move regularly.  In the chart below you can look up how much fiber you are getting in an average day.  If you are not getting enough fiber, you should add a fiber supplement to your diet.  Examples of this include Metamucil, FiberCon and Citrucel.  These can be purchased at your local grocery store or pharmacy.       [email or web address]

## 2013-05-30 NOTE — Progress Notes (Signed)
Chief Complaint  Patient presents with  . New Evaluation    severe constipation    HISTORY: KATAYA GUIMONT is a 47 y.o. female who presents to the office with worsening constipation.  She currently doing very high fiber diet and miralax daily.  Other symptoms include rectal pain, nausea and abd pain.  This had been occurring for 2 years.  She has tried laxatives in the past with no success.  Nothing makes the symptoms worse.   It is continuous in nature.  Her bowel habits are irregular (but getting better) and her bowel movements are hard.  Her fiber intake is good.  Her last colonoscopy was in Jan 2012.  Her calcium levels have been normal.  She is due to get her thyroid rechecked later this month.   Past Medical History  Diagnosis Date  . Anxiety   . Depression   . Colon polyps     hyperplastic  . Bipolar 2 disorder   . Hypertension   . Hyperlipidemia   . Myocardial infarction       Past Surgical History  Procedure Laterality Date  . Cesarean section      x2  . Abdominal hysterectomy      2010  . Ankle pin      plate and 6 screws; right  . Hernia repair    . Cardiac catheterization          Current Outpatient Prescriptions  Medication Sig Dispense Refill  . amoxicillin-clavulanate (AUGMENTIN) 875-125 MG per tablet       . citalopram (CELEXA) 20 MG tablet Take 20 mg by mouth daily.      . clonazePAM (KLONOPIN) 0.5 MG tablet Take 0.5 mg by mouth 3 (three) times daily as needed. Anxiety or panic attack       . hydrocortisone (ANUSOL-HC) 2.5 % rectal cream Use rectally  2-3 x daily  30 g  1  . lamoTRIgine (LAMICTAL) 25 MG tablet Take 25 mg by mouth daily.       No current facility-administered medications for this visit.      Allergies  Allergen Reactions  . Azithromycin Anaphylaxis     Blister on face and mouth   . Penicillins     REACTION: Red man Syndrome  . Codeine Sulfate     REACTION: Rash  . Erythromycin Base   . Oxycodone-Acetaminophen     REACTION:  hives,itching, swelling face and eyes  . Pseudoephedrine Hcl Er Hives    whelps      Family History  Problem Relation Age of Onset  . Heart disease Mother     died age 61  . Asthma Mother   . Heart disease Father     died age 74  . Breast cancer Sister   . Stroke Maternal Grandmother   . Asthma Maternal Grandmother   . Colon polyps Brother     x 2  . Mental illness Paternal Grandmother   . Colon cancer Neg Hx     History   Social History  . Marital Status: Married    Spouse Name: N/A    Number of Children: N/A  . Years of Education: N/A   Social History Main Topics  . Smoking status: Current Every Day Smoker -- 1.00 packs/day    Types: Cigarettes  . Smokeless tobacco: Never Used  . Alcohol Use: Yes  . Drug Use: No  . Sexually Active: None   Other Topics Concern  . None   Social History  Narrative  . None      REVIEW OF SYSTEMS - PERTINENT POSITIVES ONLY: Review of Systems - General ROS: negative for - chills, fever or weight loss Hematological and Lymphatic ROS: negative for - bleeding problems, blood clots or bruising Respiratory ROS: no cough, shortness of breath, or wheezing Cardiovascular ROS: no chest pain or dyspnea on exertion Gastrointestinal ROS: positive for - abdominal pain, blood in stools, constipation and nausea negative for - abdominal pain, change in stools or stool incontinence Genito-Urinary ROS: no dysuria, trouble voiding, or hematuria  EXAM: Filed Vitals:   05/30/13 1601  BP: 122/74  Pulse: 62  Temp: 98.6 F (37 C)  Resp: 16    General appearance: alert and cooperative Resp: clear to auscultation bilaterally Cardio: regular rate and rhythm GI: normal findings: soft, non-tender and no distention Anal Exam: mild tenderness to palpation of the levators bilaterally, good tone and squeeze, no sphincter hypertension.  No stool in rectal vault, weak push    ASSESSMENT AND PLAN: NAYELIS BONITO is a 47 y.o. F with chronic  constipation that worsened 2 years ago.  She has tried many medical treatments with little success.  She is s/p a recent disimpaction in the ED about 2 weeks ago.  She has felt better since then but is still having nausea.  I encouraged her to continue the high fiber diet and take the miralax or enemas as needed.  We discussed toileting habits as well.  I will order a Sitz marker study to evaluate her transit.  I suspect there is a large component of pelvic floor outlet dysfunction, but her chronic nausea makes me concerned for a total GI motility problem.  Both of these problems would not be helped much by surgery.  I think she would be an excellent candidate for biofeedback if this seems warranted on testing results.   I will see her back in 4 weeks.  I encouraged her to continue to see Dr Arlyce Dice regarding her nausea.  I have given her a prescription for zofran temporarily until she can see him.    Vanita Panda, MD Colon and Rectal Surgery / General Surgery [facility]      Visit Diagnoses: No diagnosis found.  Primary Care Physician: Herb Grays, MD

## 2013-05-31 ENCOUNTER — Telehealth (INDEPENDENT_AMBULATORY_CARE_PROVIDER_SITE_OTHER): Payer: Self-pay

## 2013-05-31 NOTE — Telephone Encounter (Signed)
[facility] called stating Sitz marks cap for DG Abd is not available may want to try another pharmacy and it is very expensive . Patient is Phyllis Garza DOB  10/11/1966

## 2013-05-31 NOTE — Telephone Encounter (Signed)
I will contact a GI office to see if they know how to prescribe it.

## 2013-05-31 NOTE — Telephone Encounter (Signed)
I called and left a message for Dr Marzetta Board nurse letting her know the pt is unable to find the sitz marker.  I asked if they routinely order the Sitz Marker Study and if she knows how to prescribe the capsule.  Await call back.

## 2013-05-31 NOTE — Telephone Encounter (Signed)
Patient has called again to ask what she is suppose to do.  Patient states the pharmacy told her no where carries this medication any more.

## 2013-06-01 NOTE — Telephone Encounter (Signed)
Please let the patient know that we are working on getting the capsule.

## 2013-06-01 NOTE — Telephone Encounter (Signed)
I called [phone] and spoke to someone.  She said you get 10 in a box and they are $230.  She said I can go through our distributor [employer]) and it may be cheaper.  I will look in to that.

## 2013-06-01 NOTE — Telephone Encounter (Signed)
Sitzmarkers-radiopaque markers can be ordered by calling [phone] from 8-4:30pm or go online at [email or web address].

## 2013-06-09 ENCOUNTER — Telehealth (INDEPENDENT_AMBULATORY_CARE_PROVIDER_SITE_OTHER): Payer: Self-pay

## 2013-06-09 NOTE — Telephone Encounter (Signed)
I left a message for the pt to call me.  Her sitz marker capsule is here.  I want to see if she will pick it up here.

## 2013-06-09 NOTE — Telephone Encounter (Signed)
The pt called me back.  She will pick up her capsule today.

## 2013-06-14 ENCOUNTER — Ambulatory Visit
Admission: RE | Admit: 2013-06-14 | Discharge: 2013-06-14 | Disposition: A | Discharge status: Home / Self Care | Payer: 59 | Source: Ambulatory Visit | Attending: General Surgery | Admitting: General Surgery

## 2013-06-14 DIAGNOSIS — K5900 Constipation, unspecified: Secondary | ICD-10-CM

## 2013-06-16 ENCOUNTER — Telehealth (INDEPENDENT_AMBULATORY_CARE_PROVIDER_SITE_OTHER): Payer: Self-pay | Admitting: *Deleted

## 2013-06-16 ENCOUNTER — Ambulatory Visit
Admission: RE | Admit: 2013-06-16 | Discharge: 2013-06-16 | Disposition: A | Discharge status: Home / Self Care | Payer: 59 | Source: Ambulatory Visit | Attending: General Surgery | Admitting: General Surgery

## 2013-06-16 DIAGNOSIS — K5900 Constipation, unspecified: Secondary | ICD-10-CM

## 2013-06-16 NOTE — Telephone Encounter (Signed)
Patient states she went this morning to check on the capsules and was told they are still all inside her.  Patient wondering about what to do over the weekend.  Patient asking if she should go some where else over the weekend or just wait until Monday to have another XR completed.

## 2013-06-16 NOTE — Telephone Encounter (Signed)
Monday is fine. Thanks

## 2013-06-16 NOTE — Telephone Encounter (Signed)
Patient updated at this time and agreeable. 

## 2013-06-20 ENCOUNTER — Ambulatory Visit
Admission: RE | Admit: 2013-06-20 | Discharge: 2013-06-20 | Disposition: A | Discharge status: Home / Self Care | Payer: 59 | Source: Ambulatory Visit | Attending: General Surgery | Admitting: General Surgery

## 2013-06-20 ENCOUNTER — Telehealth (INDEPENDENT_AMBULATORY_CARE_PROVIDER_SITE_OTHER): Payer: Self-pay | Admitting: General Surgery

## 2013-06-20 DIAGNOSIS — K5900 Constipation, unspecified: Secondary | ICD-10-CM

## 2013-06-20 NOTE — Telephone Encounter (Signed)
Please tell her that I am monitoring her films, and I agree.  A film on Fri would probably be more helpful.

## 2013-06-20 NOTE — Telephone Encounter (Signed)
Patient called and stated she went to have the marker x-ray done this morning. The tech told her there were 12 markers left, but they were down low. Patient did mention to the tech she would see them Wednesday. Tech asked patient to call our office today after 3 to see if we wanted her to go back then or wait a few more days. Please advise.

## 2013-06-20 NOTE — Telephone Encounter (Signed)
I called the patient back and told her to wait until Friday to go.  She asked about who should tell her when to go back after Friday.  I told her Dr Maisie Fus because she is watching to see how long it takes for the markers to pass through.  I told her to wait until Dr Maisie Fus reviews the result from Friday and then we will call her about if she needs to go back.

## 2013-06-21 NOTE — Telephone Encounter (Signed)
Opened in error

## 2013-06-23 ENCOUNTER — Ambulatory Visit
Admission: RE | Admit: 2013-06-23 | Discharge: 2013-06-23 | Disposition: A | Discharge status: Home / Self Care | Payer: 59 | Source: Ambulatory Visit | Attending: General Surgery | Admitting: General Surgery

## 2013-06-23 ENCOUNTER — Telehealth (INDEPENDENT_AMBULATORY_CARE_PROVIDER_SITE_OTHER): Payer: Self-pay | Admitting: Surgery

## 2013-06-23 DIAGNOSIS — K5900 Constipation, unspecified: Secondary | ICD-10-CM

## 2013-06-23 NOTE — Telephone Encounter (Signed)
Patient called worried.  She is struggling with abdominal pain.  She has been off of her MiraLAX bowel regimen for the sitz marker bowel study.  Working up for her severe constipation/colonic inertia.  She noted she was having left-sided cramping and back pain.  Tolerating liquids but appetite down.  No vomiting.  Had a bowel movement yesterday.  Had the last x-rays done today.  Wanted to know what could be done.  Spend a fair amount of time talking about her numerous ER visits, disimpactions, enemas, colonoscopy, frustration.  I tried to emphasize with her situation, but I noted that at night time limited on a device.  I recommended she call her office on the morning and see what else can be done.  Since she has completed her colon Sitzmarks study today, I recommended she get back on her bowel regimen,   Double dose MiraLAX twice a day.  Consider an extra dose or two to get things starting.  Walk an hour a day.  Warm soaks in the bath.  Advance to a low-fat high-fiber diet gradually.  She can try enemas as needed but try these other things first.  I cautioned her that it can take several months on a bowel regimen for the colon to normalize.  After discussing for some time, she felt a little less anxious.  She was worried that she would perforate her colon.  I stressed the importance of a complete workup to sort out what is going on with her so we can provide the best advice and not rush her to any operation.  *RADIOLOGY REPORT*  Clinical Data: History of constipation  ABDOMEN - 1 VIEW  Comparison: 06/20/2013  Findings: Large and small bowel gas is identified. Fecal material  is seen within the colon. The previously seen eight sitz markers  have now all passed. No retained markers are noted.  IMPRESSION:  No acute abnormality noted. All sitz markers have passed.  Original Report Authenticated By: Alcide Clever, M.D.

## 2013-06-27 ENCOUNTER — Ambulatory Visit
Admission: RE | Admit: 2013-06-27 | Discharge: 2013-06-27 | Disposition: A | Discharge status: Home / Self Care | Payer: 59 | Source: Ambulatory Visit | Attending: General Surgery | Admitting: General Surgery

## 2013-06-27 DIAGNOSIS — K5900 Constipation, unspecified: Secondary | ICD-10-CM

## 2013-06-28 ENCOUNTER — Ambulatory Visit (INDEPENDENT_AMBULATORY_CARE_PROVIDER_SITE_OTHER): Payer: 59 | Admitting: General Surgery

## 2013-06-28 ENCOUNTER — Encounter (INDEPENDENT_AMBULATORY_CARE_PROVIDER_SITE_OTHER): Payer: Self-pay | Admitting: General Surgery

## 2013-06-28 VITALS — BP 106/84 | HR 102 | Temp 98.2°F | Ht 65.0 in | Wt 116.6 lb

## 2013-06-28 DIAGNOSIS — K5904 Chronic idiopathic constipation: Secondary | ICD-10-CM

## 2013-06-28 DIAGNOSIS — K5909 Other constipation: Secondary | ICD-10-CM

## 2013-06-28 NOTE — Progress Notes (Signed)
Phyllis Garza is a 47 y.o. female who is here for a follow up visit regarding her constipation.  Her Sitz marker test is complete.  The markers passed through the small bowel quickly.  There was good transit into the left colon but then the markers slowed quite a bit.  All markers were evacuated by 10 days.  We had a long discussion about her symptoms.  Objective: Filed Vitals:   06/28/13 1114  BP: 106/84  Pulse: 102  Temp: 98.2 F (36.8 C)    General appearance: alert and cooperative Abd: soft, tender on L side  Procedure: Anoscopy Surgeon: Maisie Fus Assistant: Christella Scheuermann After the risks and benefits were explained, verbal consent was obtained for above procedure  Anesthesia: none Diagnosis: constipation Findings: no levator hypertrophy or tenderness, good sphincter tone, no stool in rectal vault, ok push (some lack of relaxation noted at first), grade 1-2 internal hemorrhoids, no masses   Assessment and Plan: Phyllis Garza is a 47 y.o. F with constipation.  She has complete her Sitz marker study and is here today to discuss the results.  Her Sitz study seems to have a component of slow transit, but I think she also has some pelvic floor relaxation issues.   I don't think her nausea is related to her constipation because it gets worse after BM's instead of better, there is no small bowel dilation on her Sitz study and the markers passed through her small bowel with no evidence of slow transit.  She and I are in agreement that surgery will be a last resort to treat her constipation, as this is a functional issue and usually does not respond well long term to surgery.  She will try a liquid diet with 2-3 bottles of Boost daily.  She will slowly add in solid foods.  She will continue her Miralax daily. She will use zofran as needed for nausea.  She will do warm soap suds enemas in the evenings to help with BM's and left sided abd pain.  I have referred her to a pelvic floor physical therapist at  [facility] to help with pelvic floor relaxation.  I will see her back in 1 month for follow up.      Vanita Panda, MD [facility], [place] [phone]

## 2013-06-28 NOTE — Patient Instructions (Signed)
Continue high fiber diet and miralax.  Could try all liquid diet.  Start warm soap suds enemas daily.  Ok to use zofran for nausea.

## 2013-07-02 ENCOUNTER — Emergency Department (HOSPITAL_COMMUNITY)
Admission: EM | Admit: 2013-07-02 | Discharge: 2013-07-02 | Disposition: A | Discharge status: Home / Self Care | Payer: 59 | Attending: Emergency Medicine | Admitting: Emergency Medicine

## 2013-07-02 ENCOUNTER — Encounter (HOSPITAL_COMMUNITY): Payer: Self-pay | Admitting: Emergency Medicine

## 2013-07-02 DIAGNOSIS — Z860100 Personal history of colon polyps, unspecified: Secondary | ICD-10-CM | POA: Insufficient documentation

## 2013-07-02 DIAGNOSIS — F411 Generalized anxiety disorder: Secondary | ICD-10-CM | POA: Insufficient documentation

## 2013-07-02 DIAGNOSIS — Z8639 Personal history of other endocrine, nutritional and metabolic disease: Secondary | ICD-10-CM | POA: Insufficient documentation

## 2013-07-02 DIAGNOSIS — F319 Bipolar disorder, unspecified: Secondary | ICD-10-CM | POA: Insufficient documentation

## 2013-07-02 DIAGNOSIS — Z9861 Coronary angioplasty status: Secondary | ICD-10-CM | POA: Insufficient documentation

## 2013-07-02 DIAGNOSIS — K5900 Constipation, unspecified: Secondary | ICD-10-CM | POA: Insufficient documentation

## 2013-07-02 DIAGNOSIS — R110 Nausea: Secondary | ICD-10-CM

## 2013-07-02 DIAGNOSIS — R531 Weakness: Secondary | ICD-10-CM

## 2013-07-02 DIAGNOSIS — Z880 Allergy status to penicillin: Secondary | ICD-10-CM | POA: Insufficient documentation

## 2013-07-02 DIAGNOSIS — R109 Unspecified abdominal pain: Secondary | ICD-10-CM | POA: Insufficient documentation

## 2013-07-02 DIAGNOSIS — Z90710 Acquired absence of both cervix and uterus: Secondary | ICD-10-CM | POA: Insufficient documentation

## 2013-07-02 DIAGNOSIS — I252 Old myocardial infarction: Secondary | ICD-10-CM | POA: Insufficient documentation

## 2013-07-02 DIAGNOSIS — Z862 Personal history of diseases of the blood and blood-forming organs and certain disorders involving the immune mechanism: Secondary | ICD-10-CM | POA: Insufficient documentation

## 2013-07-02 DIAGNOSIS — Z9889 Other specified postprocedural states: Secondary | ICD-10-CM | POA: Insufficient documentation

## 2013-07-02 DIAGNOSIS — R5381 Other malaise: Secondary | ICD-10-CM | POA: Insufficient documentation

## 2013-07-02 DIAGNOSIS — I10 Essential (primary) hypertension: Secondary | ICD-10-CM | POA: Insufficient documentation

## 2013-07-02 DIAGNOSIS — Z8719 Personal history of other diseases of the digestive system: Secondary | ICD-10-CM | POA: Insufficient documentation

## 2013-07-02 DIAGNOSIS — F17200 Nicotine dependence, unspecified, uncomplicated: Secondary | ICD-10-CM | POA: Insufficient documentation

## 2013-07-02 DIAGNOSIS — Z79899 Other long term (current) drug therapy: Secondary | ICD-10-CM | POA: Insufficient documentation

## 2013-07-02 LAB — COMPREHENSIVE METABOLIC PANEL
CO2: 24 mEq/L (ref 19–32)
Calcium: 9.1 mg/dL (ref 8.4–10.5)
Creatinine, Ser: 0.55 mg/dL (ref 0.50–1.10)
GFR calc Af Amer: >90 mL/min (ref >90)
GFR calc non Af Amer: >90 mL/min (ref >90)
Glucose, Bld: 101 mg/dL — ABNORMAL HIGH (ref 70–99)

## 2013-07-02 LAB — CBC
Hemoglobin: 15 g/dL (ref 12.0–15.0)
MCH: 30.3 pg (ref 26.0–34.0)
MCHC: 35.2 g/dL (ref 30.0–36.0)

## 2013-07-02 LAB — URINALYSIS, ROUTINE W REFLEX MICROSCOPIC
Glucose, UA: NEGATIVE mg/dL
Hgb urine dipstick: NEGATIVE
Ketones, ur: NEGATIVE mg/dL
Leukocytes, UA: NEGATIVE
Protein, ur: NEGATIVE mg/dL

## 2013-07-02 MED ORDER — OMEPRAZOLE 20 MG PO CPDR: 20.0000 mg | DELAYED_RELEASE_CAPSULE | Freq: Every day | ORAL | Status: DC

## 2013-07-02 MED ORDER — ONDANSETRON 8 MG PO TBDP: ORAL_TABLET | ORAL | Status: DC

## 2013-07-02 MED ORDER — ONDANSETRON HCL 4 MG/2ML IJ SOLN
4.0000 mg | Freq: Once | INTRAMUSCULAR | Status: AC
  Administered 2013-07-02: 4 mg via INTRAVENOUS
  Filled 2013-07-02: qty 2

## 2013-07-02 MED ORDER — SODIUM CHLORIDE 0.9 % IV BOLUS (SEPSIS)
1000.0000 mL | Freq: Once | INTRAVENOUS | Status: AC
  Administered 2013-07-02: 1000 mL via INTRAVENOUS

## 2013-07-02 MED ORDER — ONDANSETRON HCL 8 MG PO TABS: 8.0000 mg | ORAL_TABLET | Freq: Three times a day (TID) | ORAL | Status: DC | PRN

## 2013-07-02 NOTE — ED Notes (Signed)
Pt here from home with c/o weakness and no appetite , pt had a markers test completed last week for a slow colon by Dr Maisie Fus

## 2013-07-02 NOTE — ED Provider Notes (Signed)
History    CSN: [id number] Arrival date & time 07/02/13  1358  First MD Initiated Contact with Patient 07/02/13 1459     Chief Complaint  Patient presents with  . Weakness   (Consider location/radiation/quality/duration/timing/severity/associated sxs/prior Treatment) HPI Comments: Patient with history of Bipolar Disorder, undiagnosed stomach issues that cause her to have a slow transit through her large bowel.  She has seen multiple specialists and has had multiple tests done to diagnose this.  She presents today complaining of weakness, "not feeling well".  This has been worsening over the past few weeks.  She says she is unable to eat or drink and "has not had a meal in two weeks."  This is affecting her ability to take her bipolar meds.  No bloody stool or fevers.    Patient is a 47 y.o. female presenting with weakness. The history is provided by the patient.  Weakness This is a chronic problem. Episode onset: 2 years ago. The problem occurs constantly. The problem has been gradually worsening. Associated symptoms include abdominal pain. Pertinent negatives include no shortness of breath. The symptoms are aggravated by eating. Nothing relieves the symptoms. She has tried nothing for the symptoms. The treatment provided no relief.   Past Medical History  Diagnosis Date  . Anxiety   . Depression   . Colon polyps     hyperplastic  . Bipolar 2 disorder   . Hypertension   . Hyperlipidemia   . Myocardial infarction    Past Surgical History  Procedure Laterality Date  . Cesarean section      x2  . Abdominal hysterectomy      2010  . Ankle pin      plate and 6 screws; right  . Hernia repair    . Cardiac catheterization     Family History  Problem Relation Age of Onset  . Heart disease Mother     died age 77  . Asthma Mother   . Heart disease Father     died age 24  . Breast cancer Sister   . Stroke Maternal Grandmother   . Asthma Maternal Grandmother   . Colon polyps  Brother     x 2  . Mental illness Paternal Grandmother   . Colon cancer Neg Hx    History  Substance Use Topics  . Smoking status: Current Every Day Smoker -- 1.00 packs/day    Types: Cigarettes  . Smokeless tobacco: Never Used  . Alcohol Use: No   OB History   Grav Para Term Preterm Abortions TAB SAB Ect Mult Living                 Review of Systems  Constitutional: Positive for activity change, appetite change and fatigue. Negative for fever, chills and diaphoresis.  Respiratory: Negative for shortness of breath.   Gastrointestinal: Positive for nausea, abdominal pain and constipation. Negative for vomiting and diarrhea.  Neurological: Positive for weakness.  All other systems reviewed and are negative.    Allergies  Azithromycin; Penicillins; Codeine sulfate; Erythromycin base; Oxycodone-acetaminophen; and Pseudoephedrine hcl er  Home Medications   Current Outpatient Rx  Name  Route  Sig  Dispense  Refill  . citalopram (CELEXA) 10 MG tablet   Oral   Take 10 mg by mouth 2 (two) times daily.         . clonazePAM (KLONOPIN) 0.5 MG tablet   Oral   Take 0.5 mg by mouth 3 (three) times daily as needed. Anxiety or  panic attack          . lamoTRIgine (LAMICTAL) 100 MG tablet   Oral   Take 100 mg by mouth daily.         . ondansetron (ZOFRAN) 4 MG tablet   Oral   Take 1 tablet (4 mg total) by mouth every 8 (eight) hours as needed for nausea.   20 tablet   0   . polyethylene glycol powder (GLYCOLAX/MIRALAX) powder   Oral   Take 17 g by mouth 4 (four) times daily as needed.         . sodium phosphate (FLEET) enema   Rectal   Place 1 enema rectally once. follow package directions          BP 136/92  Pulse 101  Temp(Src) 99.1 F (37.3 C) (Oral)  Resp 20  SpO2 96% Physical Exam  Nursing note and vitals reviewed. Constitutional: She is oriented to person, place, and time. She appears well-developed and well-nourished. No distress.  HENT:  Head:  Normocephalic and atraumatic.  Mouth/Throat: Oropharynx is clear and moist.  Eyes: EOM are normal. Pupils are equal, round, and reactive to light.  Neck: Normal range of motion. Neck supple.  Cardiovascular: Normal rate and regular rhythm.  Exam reveals no gallop and no friction rub.   No murmur heard. Pulmonary/Chest: Effort normal and breath sounds normal. No respiratory distress. She has no wheezes.  Abdominal: Soft. Bowel sounds are normal. She exhibits no distension. There is no tenderness.  Musculoskeletal: Normal range of motion.  Neurological: She is alert and oriented to person, place, and time.  Skin: Skin is warm and dry. She is not diaphoretic.    ED Course  Procedures (including critical care time) Labs Reviewed  URINALYSIS, ROUTINE W REFLEX MICROSCOPIC - Abnormal; Notable for the following:    APPearance HAZY (*)    All other components within normal limits  COMPREHENSIVE METABOLIC PANEL - Abnormal; Notable for the following:    Glucose, Bld 101 (*)    All other components within normal limits  CBC   No results found. No diagnosis found.   Date: 07/02/2013  Rate: 63  Rhythm: normal sinus rhythm  QRS Axis: normal  Intervals: normal  ST/T Wave abnormalities: normal  Conduction Disutrbances:none  Narrative Interpretation:   Old EKG Reviewed: none available    MDM  The patient presents here with complaints of weakness, fatigue, nausea, and a history of long-standing GI issues.  She says she has had difficulty eating and taking her medications.  I suspect her symptoms are a combination of not taking her bipolar meds routinely and some mild dehydration although her labs and physical exam are reassuring.  She was given ivf and zofran and appears to be resting comfortably.  Will discharge to home with zofran, prilosec, return prn.   Geoffery Lyons, MD 07/02/13 [phone]

## 2013-07-22 ENCOUNTER — Telehealth (INDEPENDENT_AMBULATORY_CARE_PROVIDER_SITE_OTHER): Payer: Self-pay

## 2013-07-22 NOTE — Telephone Encounter (Signed)
I called and left a message for Phyllis Garza who schedules these appointments.  I asked her to check and see if they rec'd the referral and let me know.  I called the pt and let her know that I left 2 messages for [facility].  I need to see if they have the referral info and what is the status on that.  I told her we should cancel her appointment for now until she has had therapy.  She agrees.  I will call her back or she will call me depending on who [facility] calls 1st.

## 2013-07-22 NOTE — Telephone Encounter (Signed)
Pt calling again b/c she has not heard from [facility] about getting physical therapy. The pt called here last week to report that she had not heard anything on the physical therapy but someone told her that they were probably behind in scheduling appt's and they would call her when they got to her info.there was no note documented in her chart about the call. The pt was calling b/c she knew she had an appt with Dr Maisie Fus coming up and was supposed to have her physical therapy done first. The pt wants to know if someone can check on the therapy appt and does she need to r/s her appt with Dr Maisie Fus for next week since she has not had the physical therapy. Pls call pt.

## 2013-07-22 NOTE — Telephone Encounter (Signed)
I had not heard that the pt hasn't been seen yet.  I called and left a message for the therapist [name] to see if they didn't get my referral I faxed over.  I faxed a referral and her office note to [phone].  They usually call the pt with the appointment.  I await Wilda's call.

## 2013-07-25 ENCOUNTER — Ambulatory Visit (INDEPENDENT_AMBULATORY_CARE_PROVIDER_SITE_OTHER): Payer: 59 | Admitting: General Surgery

## 2013-07-25 NOTE — Telephone Encounter (Signed)
I agree.  Please make sure that she starts seeing [name] as soon as possible.  I will see her back once she has started her PT

## 2013-07-25 NOTE — Telephone Encounter (Signed)
Phyllis Garza called back today.  She had not rec'd the referral.  Referral information was refaxed.  She will expedite.

## 2013-07-26 NOTE — Telephone Encounter (Signed)
I left a message for [name] [phone], ext [phone] to see if she scheduled the pt.  Await her call back.

## 2013-08-20 ENCOUNTER — Emergency Department (HOSPITAL_COMMUNITY)
Admission: EM | Admit: 2013-08-20 | Discharge: 2013-08-20 | Disposition: A | Discharge status: Home / Self Care | Payer: 59 | Attending: Emergency Medicine | Admitting: Emergency Medicine

## 2013-08-20 ENCOUNTER — Encounter (HOSPITAL_COMMUNITY): Payer: Self-pay | Admitting: Emergency Medicine

## 2013-08-20 DIAGNOSIS — F411 Generalized anxiety disorder: Secondary | ICD-10-CM | POA: Insufficient documentation

## 2013-08-20 DIAGNOSIS — I10 Essential (primary) hypertension: Secondary | ICD-10-CM | POA: Insufficient documentation

## 2013-08-20 DIAGNOSIS — F329 Major depressive disorder, single episode, unspecified: Secondary | ICD-10-CM

## 2013-08-20 DIAGNOSIS — Z880 Allergy status to penicillin: Secondary | ICD-10-CM | POA: Insufficient documentation

## 2013-08-20 DIAGNOSIS — Z860100 Personal history of colon polyps, unspecified: Secondary | ICD-10-CM | POA: Insufficient documentation

## 2013-08-20 DIAGNOSIS — Z79899 Other long term (current) drug therapy: Secondary | ICD-10-CM | POA: Insufficient documentation

## 2013-08-20 DIAGNOSIS — F3189 Other bipolar disorder: Secondary | ICD-10-CM | POA: Insufficient documentation

## 2013-08-20 DIAGNOSIS — F319 Bipolar disorder, unspecified: Secondary | ICD-10-CM

## 2013-08-20 DIAGNOSIS — F17200 Nicotine dependence, unspecified, uncomplicated: Secondary | ICD-10-CM | POA: Insufficient documentation

## 2013-08-20 DIAGNOSIS — Z8639 Personal history of other endocrine, nutritional and metabolic disease: Secondary | ICD-10-CM | POA: Insufficient documentation

## 2013-08-20 DIAGNOSIS — Z862 Personal history of diseases of the blood and blood-forming organs and certain disorders involving the immune mechanism: Secondary | ICD-10-CM | POA: Insufficient documentation

## 2013-08-20 DIAGNOSIS — F429 Obsessive-compulsive disorder, unspecified: Secondary | ICD-10-CM | POA: Insufficient documentation

## 2013-08-20 DIAGNOSIS — I252 Old myocardial infarction: Secondary | ICD-10-CM | POA: Insufficient documentation

## 2013-08-20 LAB — CBC WITH DIFFERENTIAL/PLATELET
Basophils Absolute: 0.1 10*3/uL (ref 0.0–0.1)
Basophils Relative: 1 % (ref 0–1)
Eosinophils Relative: 3 % (ref 0–5)
HCT: 47 % — ABNORMAL HIGH (ref 36.0–46.0)
MCHC: 33.6 g/dL (ref 30.0–36.0)
MCV: 87.5 fL (ref 78.0–100.0)
Monocytes Absolute: 0.4 10*3/uL (ref 0.1–1.0)
RDW: 12.4 % (ref 11.5–15.5)

## 2013-08-20 LAB — URINALYSIS, ROUTINE W REFLEX MICROSCOPIC
Glucose, UA: NEGATIVE mg/dL
Leukocytes, UA: NEGATIVE
Protein, ur: NEGATIVE mg/dL

## 2013-08-20 LAB — COMPREHENSIVE METABOLIC PANEL
AST: 15 U/L (ref 0–37)
Albumin: 4 g/dL (ref 3.5–5.2)
CO2: 28 mEq/L (ref 19–32)
Calcium: 9.6 mg/dL (ref 8.4–10.5)
Creatinine, Ser: 0.58 mg/dL (ref 0.50–1.10)
GFR calc non Af Amer: >90 mL/min (ref >90)

## 2013-08-20 LAB — RAPID URINE DRUG SCREEN, HOSP PERFORMED
Amphetamines: NOT DETECTED
Opiates: NOT DETECTED

## 2013-08-20 NOTE — ED Provider Notes (Signed)
CSN: [id number]     Arrival date & time 08/20/13  1224 History     First MD Initiated Contact with Patient 08/20/13 1245     Chief Complaint  Patient presents with  . Medical Clearance   (Consider location/radiation/quality/duration/timing/severity/associated sxs/prior Treatment) HPI Patient reports she has a long history of bipolar disorder. She reports there's been a lot of stress in her life recently. 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 She also reports that she started homeschooling her 1 year old son this week which she has  done for years before. She also reports her therapist at 47 years recently lost his license for some professional behavior and she's been reluctant to get a new therapist. She states however he is not well motivated and he is studying very complicated topics such as physics. She states normally she has the high before she gets the lows with her bipolar however the last episode she had she just went straight into the low. She reports she feels like she is going to the lows now. She reports this past week she's feeling tired. She feels like she doesn't want to get out of bed and doesn't want to take a shower. She also has some physical problems related to her GI tract. She is followed by Dr. Arlyce Dice GI and Dr. Maisie Fus colon  Surgeon. She has been on a liquid diet for the last 2 months and states whenever she tries to eat she gets sick. She has lost weight from 158 pounds down to 113 pounds prior to this diet because of her GI problems. 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 She states couple years ago she went to the intensive outpatient program at behavioral health although she did mention to me she didn't feel like it helped. She also reports over 8 years ago she used to cut herself. She called her psychiatrist who told her to come to the ED there was a  psychiatrist in our ED that could talk to her. She states they tried to do telepsych consult a few months ago at [facility] which she did not like.   PCP Dr Hildred Alamin Psychiatrist Dr Donnie Aho with [facility]  Past Medical History  Diagnosis Date  . Anxiety   . Depression   . Colon polyps     hyperplastic  . Bipolar 2 disorder   . Hypertension   . Hyperlipidemia   . Myocardial infarction   OCD   Past Surgical History  Procedure Laterality Date  . Cesarean section      x2  . Abdominal hysterectomy      2010  . Ankle pin      plate and 6 screws; right  . Hernia repair    . Cardiac catheterization     Family History  Problem Relation Age of Onset  . Heart disease Mother     died age 6  . Asthma Mother   . Heart disease Father     died age 46  . Breast cancer Sister   . Stroke Maternal Grandmother   . Asthma Maternal Grandmother   . Colon polyps Brother     x 2  . Mental illness Paternal Grandmother   . Colon cancer Neg Hx    History  Substance Use Topics  . Smoking status: Current Every Day Smoker -- 1.00 packs/day  Types: Cigarettes  . Smokeless tobacco: Never Used  . Alcohol Use: No  lives at home Lives with spouse   OB History   Grav Para Term Preterm Abortions TAB SAB Ect Mult Living                 Review of Systems  All other systems reviewed and are negative.    Allergies  Azithromycin; Penicillins; Codeine sulfate; Erythromycin base; Oxycodone-acetaminophen; and Pseudoephedrine hcl er  Home Medications   Current Outpatient Rx  Name  Route  Sig  Dispense  Refill  . citalopram (CELEXA) 10 MG tablet   Oral   Take 10 mg by mouth 2 (two) times daily.         . clonazePAM (KLONOPIN) 0.5 MG tablet   Oral   Take 0.25-0.5 mg by mouth 3 (three) times daily as needed. Anxiety or panic attack          . lamoTRIgine (LAMICTAL) 100 MG tablet   Oral   Take 100 mg by mouth 2 (two) times daily.          . ondansetron (ZOFRAN-ODT)  8 MG disintegrating tablet   Oral   Take 8 mg by mouth every 4 (four) hours as needed for nausea.         . polyethylene glycol powder (GLYCOLAX/MIRALAX) powder   Oral   Take 17 g by mouth 2 (two) times daily.          . sodium phosphate (FLEET) enema   Rectal   Place 1 enema rectally once. follow package directions          BP 124/90  Pulse 96  Temp(Src) 98 F (36.7 C) (Oral)  Resp 16  SpO2 96%  Vital signs normal   Physical Exam  Nursing note and vitals reviewed. Constitutional: She is oriented to person, place, and time. She appears well-developed and well-nourished.  Non-toxic appearance. She does not appear ill. No distress.  HENT:  Head: Normocephalic and atraumatic.  Right Ear: External ear normal.  Left Ear: External ear normal.  Nose: Nose normal. No mucosal edema or rhinorrhea.  Mouth/Throat: Oropharynx is clear and moist and mucous membranes are normal. No dental abscesses or edematous.  Eyes: Conjunctivae and EOM are normal. Pupils are equal, round, and reactive to light.  Neck: Normal range of motion and full passive range of motion without pain. Neck supple.  Cardiovascular: Normal rate, regular rhythm and normal heart sounds.  Exam reveals no gallop and no friction rub.   No murmur heard. Pulmonary/Chest: Effort normal and breath sounds normal. No respiratory distress. She has no wheezes. She has no rhonchi. She has no rales. She exhibits no tenderness and no crepitus.  Abdominal: Soft. Normal appearance and bowel sounds are normal. She exhibits no distension. There is no tenderness. There is no rebound and no guarding.  Musculoskeletal: Normal range of motion. She exhibits no edema and no tenderness.  Moves all extremities well.   Neurological: She is alert and oriented to person, place, and time. She has normal strength. No cranial nerve deficit.  Skin: Skin is warm, dry and intact. No rash noted. No erythema. No pallor.  Psychiatric: Her speech is  normal and behavior is normal. Her mood appears not anxious.  Flat affect, cooperative    ED Course   Procedures (including critical care time)  13:40 Julieanne Cotton, Psych PA states she will see patient after her labs are returned.   14:30 patient's labs have not resulted yet.  Patient is upset about being placed in "locked psychiatric unit". She is upset that she's been placed in scrubs and her belongings are being gone through. She states she was told there was a psychiatrist here all the time for her to talk to. She states she does not want to go back to the psychiatric ED, I told her the psychiatric PA would see her once her lab work has resulted however she does not want to wait for that. Husband is very comfortable taking her home. She did not express any suicidal or homicidal ideation to me when I first saw her and feel patient is able to decide to leave and she does not want to wait to be seen.  Results for orders placed during the hospital encounter of 08/20/13  CBC WITH DIFFERENTIAL      Result Value Range   WBC 6.5  4.0 - 10.5 K/uL   RBC 5.37 (*) 3.87 - 5.11 MIL/uL   Hemoglobin 15.8 (*) 12.0 - 15.0 g/dL   HCT 27.2 (*) 53.6 - 64.4 %   MCV 87.5  78.0 - 100.0 fL   MCH 29.4  26.0 - 34.0 pg   MCHC 33.6  30.0 - 36.0 g/dL   RDW 03.4  74.2 - 59.5 %   Platelets 206  150 - 400 K/uL   Neutrophils Relative % 58  43 - 77 %   Neutro Abs 3.8  1.7 - 7.7 K/uL   Lymphocytes Relative 32  12 - 46 %   Lymphs Abs 2.1  0.7 - 4.0 K/uL   Monocytes Relative 5  3 - 12 %   Monocytes Absolute 0.4  0.1 - 1.0 K/uL   Eosinophils Relative 3  0 - 5 %   Eosinophils Absolute 0.2  0.0 - 0.7 K/uL   Basophils Relative 1  0 - 1 %   Basophils Absolute 0.1  0.0 - 0.1 K/uL  COMPREHENSIVE METABOLIC PANEL      Result Value Range   Sodium 138  135 - 145 mEq/L   Potassium 4.4  3.5 - 5.1 mEq/L   Chloride 104  96 - 112 mEq/L   CO2 28  19 - 32 mEq/L   Glucose, Bld 91  70 - 99 mg/dL   BUN 12  6 - 23 mg/dL   Creatinine,  Ser 6.38  0.50 - 1.10 mg/dL   Calcium 9.6  8.4 - 75.6 mg/dL   Total Protein 7.0  6.0 - 8.3 g/dL   Albumin 4.0  3.5 - 5.2 g/dL   AST 15  0 - 37 U/L   ALT 11  0 - 35 U/L   Alkaline Phosphatase 51  39 - 117 U/L   Total Bilirubin 0.3  0.3 - 1.2 mg/dL   GFR calc non Af Amer >90  >90 mL/min   GFR calc Af Amer >90  >90 mL/min  ETHANOL      Result Value Range   Alcohol, Ethyl (B) <11  0 - 11 mg/dL  URINALYSIS, ROUTINE W REFLEX MICROSCOPIC      Result Value Range   Color, Urine YELLOW  YELLOW   APPearance CLEAR  CLEAR   Specific Gravity, Urine 1.018  1.005 - 1.030   pH 7.0  5.0 - 8.0   Glucose, UA NEGATIVE  NEGATIVE mg/dL   Hgb urine dipstick NEGATIVE  NEGATIVE   Bilirubin Urine NEGATIVE  NEGATIVE   Ketones, ur NEGATIVE  NEGATIVE mg/dL   Protein, ur NEGATIVE  NEGATIVE mg/dL  Urobilinogen, UA 0.2  0.0 - 1.0 mg/dL   Nitrite NEGATIVE  NEGATIVE   Leukocytes, UA NEGATIVE  NEGATIVE  URINE RAPID DRUG SCREEN (HOSP PERFORMED)      Result Value Range   Opiates NONE DETECTED  NONE DETECTED   Cocaine NONE DETECTED  NONE DETECTED   Benzodiazepines NONE DETECTED  NONE DETECTED   Amphetamines NONE DETECTED  NONE DETECTED   Tetrahydrocannabinol NONE DETECTED  NONE DETECTED   Barbiturates NONE DETECTED  NONE DETECTED   Laboratory interpretation all normal    1. Depression   2. Bipolar disorder    Plan discharge  Devoria Albe, MD, FACEP   MDM    Ward Givens, MD 08/20/13 1600

## 2013-08-20 NOTE — ED Notes (Addendum)
Pt states that she is bipolar type 2 and has "had a bad week".  States that she has been taking her medications as prescribed.  Her doctor does not see patients on the weekend so the office told her to come to the ER.  States that she has stomach problems and is seeing a Hydrographic surveyor now.  She has been on an all liquid diet for 2 months.  States that she is tired and hungry.  Denies SI/HI.

## 2013-08-24 ENCOUNTER — Telehealth (INDEPENDENT_AMBULATORY_CARE_PROVIDER_SITE_OTHER): Payer: Self-pay | Admitting: *Deleted

## 2013-08-24 NOTE — Telephone Encounter (Signed)
I'm not sure an apt sooner would change much.  If she is loosing weight, then she should try boost or ensure TID or talk to her PCP.  Needs to finish a few more biofeedback sessions before we see her again.

## 2013-08-24 NOTE — Telephone Encounter (Signed)
Patient called today to make an appt to come in to see Dr. Maisie Fus but was told it would be Sept 30th.  Patient asking if there is any way she can be seen earlier.  Patient states she is still on a liquid diet and continues loosing weight.  Patient is concerned about the weight lose but states every time she tries to eat foods they don't agree with her and she has to back back off to the liquids.  Patient states she has completed almost all of the other appts that Dr. Maisie Fus had wanted her too however she has had some difficulty due to her brother being in the hospital dying.  Patient states she really wants to come back to see Dr. Maisie Fus and try to move forward with whatever she needs to do to take care of all this.  Explained that a message will be sent to Dr. Maisie Fus and to Huntley Dec RN to ask for their input.  Patient states understanding and agreeable with this plan at this time.

## 2013-08-26 NOTE — Telephone Encounter (Signed)
I called and spoke to the pt.  She has been using Boost and Ensure.  She can only eat things like applesauce, and jello.  She has tried toast and peanut butter.  She has been on a liquid diet for 2 months and she said this isn't enough.  She is worried she will develop an eating disorder.  She drinks 3 Gatorades a day.  She would like to eat a regular diet but can't sit down and eat breakfast or lunch because she gets sick.  I asked her to try eggs for breakfast and scramble them really small or chop them up so she can have small bites and chew really well.  I said for lunch try one piece of bread with peanut butter to get in protein.  I told her a key thing to do when she tried any meats would be to chop them up into real small bites and chew thoroughly so she can break them down as much as possible before swallowing.  The pt agrees.    She has had one visit with [name] and is doing some at home exercises.  Next visit they will test her muscle tone.  I went ahead and offered her an appointment for 9/15 so she can discuss things with Dr Maisie Fus.

## 2013-09-12 ENCOUNTER — Ambulatory Visit (INDEPENDENT_AMBULATORY_CARE_PROVIDER_SITE_OTHER): Payer: 59 | Admitting: General Surgery

## 2013-09-14 ENCOUNTER — Other Ambulatory Visit: Payer: Self-pay

## 2013-09-14 DIAGNOSIS — Z1231 Encounter for screening mammogram for malignant neoplasm of breast: Secondary | ICD-10-CM

## 2013-09-26 ENCOUNTER — Encounter (INDEPENDENT_AMBULATORY_CARE_PROVIDER_SITE_OTHER): Payer: Self-pay | Admitting: General Surgery

## 2013-10-04 ENCOUNTER — Ambulatory Visit: Payer: 59

## 2013-11-03 ENCOUNTER — Other Ambulatory Visit: Payer: Self-pay

## 2014-03-21 ENCOUNTER — Emergency Department (HOSPITAL_COMMUNITY): Payer: 59

## 2014-03-21 ENCOUNTER — Encounter (HOSPITAL_COMMUNITY): Payer: Self-pay | Admitting: Emergency Medicine

## 2014-03-21 ENCOUNTER — Emergency Department (HOSPITAL_COMMUNITY)
Admission: EM | Admit: 2014-03-21 | Discharge: 2014-03-22 | Disposition: A | Discharge status: Home / Self Care | Payer: 59 | Attending: Emergency Medicine | Admitting: Emergency Medicine

## 2014-03-21 DIAGNOSIS — R1011 Right upper quadrant pain: Secondary | ICD-10-CM | POA: Insufficient documentation

## 2014-03-21 DIAGNOSIS — Z880 Allergy status to penicillin: Secondary | ICD-10-CM | POA: Insufficient documentation

## 2014-03-21 DIAGNOSIS — R110 Nausea: Secondary | ICD-10-CM | POA: Insufficient documentation

## 2014-03-21 DIAGNOSIS — F3189 Other bipolar disorder: Secondary | ICD-10-CM | POA: Insufficient documentation

## 2014-03-21 DIAGNOSIS — R109 Unspecified abdominal pain: Secondary | ICD-10-CM

## 2014-03-21 DIAGNOSIS — Z9889 Other specified postprocedural states: Secondary | ICD-10-CM | POA: Insufficient documentation

## 2014-03-21 DIAGNOSIS — R599 Enlarged lymph nodes, unspecified: Secondary | ICD-10-CM | POA: Insufficient documentation

## 2014-03-21 DIAGNOSIS — Z860100 Personal history of colon polyps, unspecified: Secondary | ICD-10-CM | POA: Insufficient documentation

## 2014-03-21 DIAGNOSIS — F17200 Nicotine dependence, unspecified, uncomplicated: Secondary | ICD-10-CM | POA: Insufficient documentation

## 2014-03-21 DIAGNOSIS — Z79899 Other long term (current) drug therapy: Secondary | ICD-10-CM | POA: Insufficient documentation

## 2014-03-21 DIAGNOSIS — Z90710 Acquired absence of both cervix and uterus: Secondary | ICD-10-CM | POA: Insufficient documentation

## 2014-03-21 DIAGNOSIS — I10 Essential (primary) hypertension: Secondary | ICD-10-CM | POA: Insufficient documentation

## 2014-03-21 DIAGNOSIS — Z3202 Encounter for pregnancy test, result negative: Secondary | ICD-10-CM | POA: Insufficient documentation

## 2014-03-21 DIAGNOSIS — R1031 Right lower quadrant pain: Secondary | ICD-10-CM | POA: Insufficient documentation

## 2014-03-21 DIAGNOSIS — I252 Old myocardial infarction: Secondary | ICD-10-CM | POA: Insufficient documentation

## 2014-03-21 DIAGNOSIS — F411 Generalized anxiety disorder: Secondary | ICD-10-CM | POA: Insufficient documentation

## 2014-03-21 DIAGNOSIS — E785 Hyperlipidemia, unspecified: Secondary | ICD-10-CM | POA: Insufficient documentation

## 2014-03-21 LAB — COMPREHENSIVE METABOLIC PANEL
ALBUMIN: 3.9 g/dL (ref 3.5–5.2)
ALK PHOS: 44 U/L (ref 39–117)
ALT: 9 U/L (ref 0–35)
AST: 16 U/L (ref 0–37)
BILIRUBIN TOTAL: 0.5 mg/dL (ref 0.3–1.2)
BUN: 7 mg/dL (ref 6–23)
CHLORIDE: 104 meq/L (ref 96–112)
CO2: 22 meq/L (ref 19–32)
Calcium: 9.3 mg/dL (ref 8.4–10.5)
Creatinine, Ser: 0.56 mg/dL (ref 0.50–1.10)
GFR calc Af Amer: >90 mL/min (ref >90)
GFR calc non Af Amer: >90 mL/min (ref >90)
Glucose, Bld: 83 mg/dL (ref 70–99)
POTASSIUM: 3.7 meq/L (ref 3.7–5.3)
SODIUM: 140 meq/L (ref 137–147)
Total Protein: 6.7 g/dL (ref 6.0–8.3)

## 2014-03-21 LAB — URINALYSIS, ROUTINE W REFLEX MICROSCOPIC
Bilirubin Urine: NEGATIVE
GLUCOSE, UA: NEGATIVE mg/dL
HGB URINE DIPSTICK: NEGATIVE
KETONES UR: NEGATIVE mg/dL
LEUKOCYTES UA: NEGATIVE
Nitrite: NEGATIVE
PH: 7 (ref 5.0–8.0)
Protein, ur: NEGATIVE mg/dL
Specific Gravity, Urine: 1.016 (ref 1.005–1.030)
Urobilinogen, UA: 0.2 mg/dL (ref 0.0–1.0)

## 2014-03-21 LAB — CBC WITH DIFFERENTIAL/PLATELET
BASOS ABS: 0.1 10*3/uL (ref 0.0–0.1)
BASOS PCT: 1 % (ref 0–1)
Eosinophils Absolute: 0.1 10*3/uL (ref 0.0–0.7)
Eosinophils Relative: 2 % (ref 0–5)
HCT: 42.9 % (ref 36.0–46.0)
Hemoglobin: 14.9 g/dL (ref 12.0–15.0)
LYMPHS PCT: 32 % (ref 12–46)
Lymphs Abs: 2.6 10*3/uL (ref 0.7–4.0)
MCH: 30.1 pg (ref 26.0–34.0)
MCHC: 34.7 g/dL (ref 30.0–36.0)
MCV: 86.7 fL (ref 78.0–100.0)
Monocytes Absolute: 0.4 10*3/uL (ref 0.1–1.0)
Monocytes Relative: 5 % (ref 3–12)
NEUTROS ABS: 4.9 10*3/uL (ref 1.7–7.7)
NEUTROS PCT: 61 % (ref 43–77)
Platelets: 213 10*3/uL (ref 150–400)
RBC: 4.95 MIL/uL (ref 3.87–5.11)
RDW: 12.3 % (ref 11.5–15.5)
WBC: 8 10*3/uL (ref 4.0–10.5)

## 2014-03-21 LAB — LIPASE, BLOOD: Lipase: 19 U/L (ref 11–59)

## 2014-03-21 LAB — PREGNANCY, URINE: Preg Test, Ur: NEGATIVE

## 2014-03-21 MED ORDER — IOHEXOL 300 MG/ML  SOLN
25.0000 mL | Freq: Once | INTRAMUSCULAR | Status: AC | PRN
  Administered 2014-03-21: 25 mL via ORAL

## 2014-03-21 MED ORDER — SODIUM CHLORIDE 0.9 % IV BOLUS (SEPSIS)
1000.0000 mL | Freq: Once | INTRAVENOUS | Status: AC
  Administered 2014-03-21: 1000 mL via INTRAVENOUS

## 2014-03-21 MED ORDER — ONDANSETRON HCL 4 MG/2ML IJ SOLN
4.0000 mg | Freq: Once | INTRAMUSCULAR | Status: AC
  Administered 2014-03-21: 4 mg via INTRAVENOUS
  Filled 2014-03-21: qty 2

## 2014-03-21 MED ORDER — IOHEXOL 300 MG/ML  SOLN
80.0000 mL | Freq: Once | INTRAMUSCULAR | Status: AC | PRN
  Administered 2014-03-21: 80 mL via INTRAVENOUS

## 2014-03-21 NOTE — ED Notes (Signed)
Per pt sts that since last night she has been having RLQ pain since last night. sts associated with nausea. Sent here by her doctor to R/O appendicitis. sts hx of severe constipation and digestive problems.

## 2014-03-21 NOTE — ED Notes (Signed)
Patient transported to Ultrasound 

## 2014-03-21 NOTE — ED Notes (Signed)
Patient transported to CT 

## 2014-03-21 NOTE — ED Provider Notes (Signed)
CSN: [id number]     Arrival date & time 03/21/14  1535 History   First MD Initiated Contact with Patient 03/21/14 1550     Chief Complaint  Patient presents with  . Abdominal Pain     (Consider location/radiation/quality/duration/timing/severity/associated sxs/prior Treatment) The history is provided by the patient. No language interpreter was used.  Phyllis Garza is a 48 y/o F with PMHx of anxiety, depression, colon polyps, bipolar disorder, HTN, HLD, MI presenting to the ED with abdominal pain that started last night at approximately 7:00PM localized to the right side of the belly button described as a stabbing pain. Patient reported that when she woke up this morning the pain has migrated to the RLQ described as a burning, stabbing pain. Stated that the pain stays within the RLQ without any radiation. Reported that she has been feeling nauseous. Reported that she has been using nothing for the pain. Stated that she was seen by her PCP this afternoon who recommended the patient to be seen and assessed in the ED for possible appendicitis. Patient reported that she has a long history of abdominal issues - reported that she used to seen by [facility], Dr. Arlyce DiceKaplan - has yet to follow in a long time. Patient reported that she is now being followed by Dr. Clovis PuA. 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 Patient has history of abdominal hysterectomy in 2010 and 2 cesarean sections as well as hernia repair. Denied vomiting, diarrhea, melena, hematochezia, urinary symptoms, hematuria, fever, chest pain, shortness of breath, difficulty breathing. PCP Dr. [facility] Dr. Arlyce DiceKaplan General surgeon Dr. Maisie Fushomas  Past Medical History  Diagnosis Date  . Anxiety   . Depression   . Colon polyps     hyperplastic  . Bipolar 2 disorder     . Hypertension   . Hyperlipidemia   . Myocardial infarction    Past Surgical History  Procedure Laterality Date  . Cesarean section      x2  . Abdominal hysterectomy      2010  . Ankle pin      plate and 6 screws; right  . Hernia repair    . Cardiac catheterization     Family History  Problem Relation Age of Onset  . Heart disease Mother     died age 969  . Asthma Mother   . Heart disease Father     died age 47  . Breast cancer Sister   . Stroke Maternal Grandmother   . Asthma Maternal Grandmother   . Colon polyps Brother     x 2  . Mental illness Paternal Grandmother   . Colon cancer Neg Hx    History  Substance Use Topics  . Smoking status: Current Every Day Smoker -- 1.00 packs/day    Types: Cigarettes  . Smokeless tobacco: Never Used  . Alcohol Use: No   OB History   Grav Para Term Preterm Abortions TAB SAB Ect Mult Living                 Review of Systems  Constitutional: Negative for fever and chills.  Respiratory: Negative for chest tightness and shortness of breath.   Cardiovascular: Negative for chest pain.  Gastrointestinal: Positive for nausea and abdominal pain (RLQ). Negative for vomiting, diarrhea, constipation, blood  in stool and anal bleeding.  Genitourinary: Negative for dysuria, hematuria, decreased urine volume, vaginal bleeding, vaginal discharge and vaginal pain.  All other systems reviewed and are negative.      Allergies  Azithromycin; Penicillins; Benadryl; Erythromycin base; Linzess; Oxycodone-acetaminophen; Pseudoephedrine hcl er; and Codeine sulfate  Home Medications   Current Outpatient Rx  Name  Route  Sig  Dispense  Refill  . citalopram (CELEXA) 10 MG tablet   Oral   Take 10 mg by mouth every morning.          . clonazePAM (KLONOPIN) 0.5 MG tablet   Oral   Take 0.25-0.5 mg by mouth 3 (three) times daily as needed for anxiety. Anxiety or panic attack          . lamoTRIgine (LAMICTAL) 100 MG tablet   Oral   Take  100 mg by mouth every morning.          . polyethylene glycol powder (GLYCOLAX/MIRALAX) powder   Oral   Take 17 g by mouth daily.          . ondansetron (ZOFRAN) 4 MG tablet   Oral   Take 1 tablet (4 mg total) by mouth every 6 (six) hours.   12 tablet   0    BP 99/56  Pulse 59  Temp(Src) 98.2 F (36.8 C) (Oral)  Resp 15  Ht 5\' 4"  (1.626 m)  Wt 118 lb (53.524 kg)  BMI 20.24 kg/m2  SpO2 94% Physical Exam  Nursing note and vitals reviewed. Constitutional: She is oriented to person, place, and time. She appears well-developed and well-nourished. No distress.  HENT:  Head: Normocephalic and atraumatic.  Mouth/Throat: Oropharynx is clear and moist. No oropharyngeal exudate.  Eyes: Conjunctivae and EOM are normal. Pupils are equal, round, and reactive to light. Right eye exhibits no discharge. Left eye exhibits no discharge.  Neck: Normal range of motion. Neck supple. No tracheal deviation present.  Negative neck stiffness Negative nuchal rigidity Cervical lymphadenopathy  Cardiovascular: Normal rate, regular rhythm and normal heart sounds.  Exam reveals no friction rub.   No murmur heard. Pulmonary/Chest: Effort normal and breath sounds normal. No respiratory distress. She has no wheezes. She has no rales.  Abdominal: Soft. Normal appearance and bowel sounds are normal. There is tenderness in the right upper quadrant and right lower quadrant. There is guarding, tenderness at McBurney's point and positive Murphy's sign. There is no rigidity and no rebound.    Negative abdominal distension Discomfort upon palpation to the right upper and right lower quadrant Positive Murphy's signs Positive McBurney's point   Musculoskeletal: Normal range of motion.  Full ROM to upper and lower extremities without difficulty noted, negative ataxia noted.  Lymphadenopathy:    She has no cervical adenopathy.  Neurological: She is alert and oriented to person, place, and time. No cranial nerve  deficit. She exhibits normal muscle tone. Coordination normal.  Skin: Skin is warm and dry. No rash noted. She is not diaphoretic. No erythema.  Psychiatric: She has a normal mood and affect. Her behavior is normal. Thought content normal.  Patient appears anxious    ED Course  Procedures (including critical care time)  11:42 PM This provider spoke with the patient regarding imaging and results. Discussed plan for [place] - patient agreed to plan.   Results for orders placed during the hospital encounter of 03/21/14  CBC WITH DIFFERENTIAL      Result Value Ref Range   WBC 8.0  4.0 - 10.5 K/uL  RBC 4.95  3.87 - 5.11 MIL/uL   Hemoglobin 14.9  12.0 - 15.0 g/dL   HCT 16.1  09.6 - 04.5 %   MCV 86.7  78.0 - 100.0 fL   MCH 30.1  26.0 - 34.0 pg   MCHC 34.7  30.0 - 36.0 g/dL   RDW 40.9  81.1 - 91.4 %   Platelets 213  150 - 400 K/uL   Neutrophils Relative % 61  43 - 77 %   Neutro Abs 4.9  1.7 - 7.7 K/uL   Lymphocytes Relative 32  12 - 46 %   Lymphs Abs 2.6  0.7 - 4.0 K/uL   Monocytes Relative 5  3 - 12 %   Monocytes Absolute 0.4  0.1 - 1.0 K/uL   Eosinophils Relative 2  0 - 5 %   Eosinophils Absolute 0.1  0.0 - 0.7 K/uL   Basophils Relative 1  0 - 1 %   Basophils Absolute 0.1  0.0 - 0.1 K/uL  COMPREHENSIVE METABOLIC PANEL      Result Value Ref Range   Sodium 140  137 - 147 mEq/L   Potassium 3.7  3.7 - 5.3 mEq/L   Chloride 104  96 - 112 mEq/L   CO2 22  19 - 32 mEq/L   Glucose, Bld 83  70 - 99 mg/dL   BUN 7  6 - 23 mg/dL   Creatinine, Ser 7.82  0.50 - 1.10 mg/dL   Calcium 9.3  8.4 - 95.6 mg/dL   Total Protein 6.7  6.0 - 8.3 g/dL   Albumin 3.9  3.5 - 5.2 g/dL   AST 16  0 - 37 U/L   ALT 9  0 - 35 U/L   Alkaline Phosphatase 44  39 - 117 U/L   Total Bilirubin 0.5  0.3 - 1.2 mg/dL   GFR calc non Af Amer >90  >90 mL/min   GFR calc Af Amer >90  >90 mL/min  LIPASE, BLOOD      Result Value Ref Range   Lipase 19  11 - 59 U/L  URINALYSIS, ROUTINE W REFLEX MICROSCOPIC      Result Value Ref  Range   Color, Urine YELLOW  YELLOW   APPearance CLOUDY (*) CLEAR   Specific Gravity, Urine 1.016  1.005 - 1.030   pH 7.0  5.0 - 8.0   Glucose, UA NEGATIVE  NEGATIVE mg/dL   Hgb urine dipstick NEGATIVE  NEGATIVE   Bilirubin Urine NEGATIVE  NEGATIVE   Ketones, ur NEGATIVE  NEGATIVE mg/dL   Protein, ur NEGATIVE  NEGATIVE mg/dL   Urobilinogen, UA 0.2  0.0 - 1.0 mg/dL   Nitrite NEGATIVE  NEGATIVE   Leukocytes, UA NEGATIVE  NEGATIVE  PREGNANCY, URINE      Result Value Ref Range   Preg Test, Ur NEGATIVE  NEGATIVE    Labs Review Labs Reviewed  URINALYSIS, ROUTINE W REFLEX MICROSCOPIC - Abnormal; Notable for the following:    APPearance CLOUDY (*)    All other components within normal limits  CBC WITH DIFFERENTIAL  COMPREHENSIVE METABOLIC PANEL  LIPASE, BLOOD  PREGNANCY, URINE   Imaging Review US Transvaginal Non-ob  03/22/2014   CLINICAL DATA:  Pelvic pain.  Partial hysterectomy.  EXAM: TRANSABDOMINAL AND TRANSVAGINAL ULTRASOUND OF PELVIS  TECHNIQUE: Both transabdominal and transvaginal ultrasound examinations of the pelvis were performed. Transabdominal technique was performed for global imaging of the pelvis including uterus, ovaries, adnexal regions, and pelvic cul-de-sac. It was necessary to proceed with endovaginal exam  following the transabdominal exam to visualize the ovaries.  COMPARISON:  CT ABD/PELVIS W CM dated 03/21/2014  FINDINGS: The uterus is absent, consistent with history of hysterectomy. The ovaries were not visualized either transabdominally or transvaginally. No pelvic mass was identified. No free fluid was seen.  IMPRESSION: Prior hysterectomy.  Ovaries not identified.   Electronically Signed   By: Sebastian Ache   On: 03/22/2014 00:20   US Pelvis Complete  03/22/2014   CLINICAL DATA:  Pelvic pain.  Partial hysterectomy.  EXAM: TRANSABDOMINAL AND TRANSVAGINAL ULTRASOUND OF PELVIS  TECHNIQUE: Both transabdominal and transvaginal ultrasound examinations of the pelvis were  performed. Transabdominal technique was performed for global imaging of the pelvis including uterus, ovaries, adnexal regions, and pelvic cul-de-sac. It was necessary to proceed with endovaginal exam following the transabdominal exam to visualize the ovaries.  COMPARISON:  CT ABD/PELVIS W CM dated 03/21/2014  FINDINGS: The uterus is absent, consistent with history of hysterectomy. The ovaries were not visualized either transabdominally or transvaginally. No pelvic mass was identified. No free fluid was seen.  IMPRESSION: Prior hysterectomy.  Ovaries not identified.   Electronically Signed   By: Sebastian Ache   On: 03/22/2014 00:20   Ct Abdomen Pelvis W Contrast  03/21/2014   CLINICAL DATA:  Right lower quadrant pain.  Appendicitis.  EXAM: CT ABDOMEN AND PELVIS WITH CONTRAST  TECHNIQUE: Multidetector CT imaging of the abdomen and pelvis was performed using the standard protocol following bolus administration of intravenous contrast.  CONTRAST:  80mL OMNIPAQUE IOHEXOL 300 MG/ML  SOLN  COMPARISON:  None.  FINDINGS: Lung Bases: Normal.  Liver:  Normal.  Spleen:  Normal.  Gallbladder:  Partially contracted.  No calcified stones.  Common bile duct:  Normal.  Pancreas:  Normal.  Adrenal glands:  Normal.  Kidneys: Normal enhancement. Normal delayed excretion of contrast from the kidneys. Both ureters are within normal limits.  Stomach:  Partially collapsed.  Small bowel: No inflammatory changes or obstruction. No mesenteric adenopathy.  Colon: Normal appendix (image 68 series 2). No inflammatory changes of colon. Distal colon appears within normal limits.  Pelvic Genitourinary: Hysterectomy. Urinary bladder appears normal. No free fluid. No adenopathy.  Bones:  No aggressive osseous lesions.  Vasculature: Atherosclerosis.  No acute vascular abnormality.  Body Wall: Normal.  IMPRESSION: 1. No acute abnormality. 2. Normal appendix. 3. Hysterectomy.   Electronically Signed   By: Andreas Newport M.D.   On: 03/21/2014 22:10      EKG Interpretation None      MDM   Final diagnoses:  Abdominal pain   Medications  sodium chloride 0.9 % bolus 1,000 mL (0 mLs Intravenous Stopped 03/21/14 1939)  ondansetron (ZOFRAN) injection 4 mg (4 mg Intravenous Given 03/21/14 1709)  iohexol (OMNIPAQUE) 300 MG/ML solution 25 mL (25 mLs Oral Contrast Given 03/21/14 1829)  ondansetron (ZOFRAN) injection 4 mg (4 mg Intravenous Given 03/21/14 1945)  sodium chloride 0.9 % bolus 1,000 mL (0 mLs Intravenous Stopped 03/21/14 2209)  iohexol (OMNIPAQUE) 300 MG/ML solution 80 mL (80 mLs Intravenous Contrast Given 03/21/14 2132)   Filed Vitals:   03/21/14 2208 03/21/14 2230 03/21/14 2300 03/21/14 2330  BP: 109/77 109/79 102/64 99/56  Pulse: 67  68 59  Temp:      TempSrc:      Resp:      Height:      Weight:      SpO2: 97%  99% 94%    Patient presenting to the ED with abdominal pain that started last  night at approximately 7:00 PM localized to the right side of the belly button that has now migrated to the right lower quadrant this morning described as a stabbing, burning sensation that is constant with associated nausea. 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 Alert and oriented. GCS 15. Heart rate and rhythm normal. Lungs clear to auscultation to upper and lower lobes bilaterally. Radiology pulses 2+ bilaterally. Bowel sounds normal active in all 4 quadrants. Negative abdominal distention identified. Positive Murphy's sign. Positive McBurney's point. CBC negative elevated white blood cell count-negative left shift or leukocytosis noted. CMP negative findings-kidneys and liver functioning properly. Lipase negative elevation. Urinalysis negative for nitrites or leukocytes-negative findings for infection. Urine pregnancy negative. CT abdomen and pelvis with contrast negative  for acute appendicitis-negative acute abnormalities noted. Hysterectomy identified. US of the pelvis negative findings - ovaries not identified.  Doubt TOA. Doubt ovarian torsion. Doubt pancreatitis. Doubt appendicitis. Patient presenting to the ED with abdominal pain of unknown origin. Patient refused pain medications - reported that she is not into narcotics. Negative elevation WBC. Patient stable, afebrile. Discharged patient. Discussed with patient to rest and stay hydrated. Discussed with patient to avoid any physical or strenuous activity. Referred to PCP, OBGYN, and GI. Discharged patient with zofran. Discussed with patient to closely monitor symptoms and if symptoms are to worsen or change to report back to the ED - strict return instructions given.  Patient agreed to plan of care, understood, all questions answered.   Raymon Mutton, PA-C 03/22/14 [id number]

## 2014-03-22 ENCOUNTER — Telehealth: Payer: Self-pay | Admitting: Gastroenterology

## 2014-03-22 ENCOUNTER — Telehealth (INDEPENDENT_AMBULATORY_CARE_PROVIDER_SITE_OTHER): Payer: Self-pay | Admitting: *Deleted

## 2014-03-22 MED ORDER — ONDANSETRON HCL 4 MG/2ML IJ SOLN
4.0000 mg | Freq: Once | INTRAMUSCULAR | Status: AC
  Administered 2014-03-22: 4 mg via INTRAVENOUS
  Filled 2014-03-22: qty 2

## 2014-03-22 MED ORDER — ONDANSETRON HCL 4 MG PO TABS: 4.0000 mg | ORAL_TABLET | Freq: Four times a day (QID) | ORAL | Status: AC

## 2014-03-22 NOTE — Discharge Instructions (Signed)
Please call your doctor for a followup appointment within 24-48 hours. When you talk to your doctor please let them know that you were seen in the emergency department and have them acquire all of your records so that they can discuss the findings with you and formulate a treatment plan to fully care for your new and ongoing problems. Please call and set-up an appointment with your primary care provider to be re-assessed within the next 24-48 hours Please call and set-up an appointment with your OBGYN, General Surgeon, and GI to be re-assessed Please rest and stay hydrated Please avoid any physical or strenuous activity Please take zofran as needed for nausea Please stick with a lite diet - one that does not contain fats, grease Please continue to monitor symptoms and if symptoms are to worsen or change (fever greater than 101, chills, sweating, nausea, vomiting, diarrhea, abdominal pain, blood in the stools, black tarry stools, chest pain, shortness of breath, difficulty breathing, inability to keep food or fluids down, pain with urination) please report back to the ED immediately   Abdominal Pain, Women Abdominal (stomach, pelvic, or belly) pain can be caused by many things. It is important to tell your doctor:  The location of the pain.  Does it come and go or is it present all the time?  Are there things that start the pain (eating certain foods, exercise)?  Are there other symptoms associated with the pain (fever, nausea, vomiting, diarrhea)? All of this is helpful to know when trying to find the cause of the pain. CAUSES   Stomach: virus or bacteria infection, or ulcer.  Intestine: appendicitis (inflamed appendix), regional ileitis (Crohn's disease), ulcerative colitis (inflamed colon), irritable bowel syndrome, diverticulitis (inflamed diverticulum of the colon), or cancer of the stomach or intestine.  Gallbladder disease or stones in the gallbladder.  Kidney disease, kidney  stones, or infection.  Pancreas infection or cancer.  Fibromyalgia (pain disorder).  Diseases of the female organs:  Uterus: fibroid (non-cancerous) tumors or infection.  Fallopian tubes: infection or tubal pregnancy.  Ovary: cysts or tumors.  Pelvic adhesions (scar tissue).  Endometriosis (uterus lining tissue growing in the pelvis and on the pelvic organs).  Pelvic congestion syndrome (female organs filling up with blood just before the menstrual period).  Pain with the menstrual period.  Pain with ovulation (producing an egg).  Pain with an IUD (intrauterine device, birth control) in the uterus.  Cancer of the female organs.  Functional pain (pain not caused by a disease, may improve without treatment).  Psychological pain.  Depression. DIAGNOSIS  Your doctor will decide the seriousness of your pain by doing an examination.  Blood tests.  X-rays.  Ultrasound.  CT scan (computed tomography, special type of X-ray).  MRI (magnetic resonance imaging).  Cultures, for infection.  Barium enema (dye inserted in the large intestine, to better view it with X-rays).  Colonoscopy (looking in intestine with a lighted tube).  Laparoscopy (minor surgery, looking in abdomen with a lighted tube).  Major abdominal exploratory surgery (looking in abdomen with a large incision). TREATMENT  The treatment will depend on the cause of the pain.   Many cases can be observed and treated at home.  Over-the-counter medicines recommended by your caregiver.  Prescription medicine.  Antibiotics, for infection.  Birth control pills, for painful periods or for ovulation pain.  Hormone treatment, for endometriosis.  Nerve blocking injections.  Physical therapy.  Antidepressants.  Counseling with a psychologist or psychiatrist.  Minor or major surgery.  HOME CARE INSTRUCTIONS   Do not take laxatives, unless directed by your caregiver.  Take over-the-counter pain  medicine only if ordered by your caregiver. Do not take aspirin because it can cause an upset stomach or bleeding.  Try a clear liquid diet (broth or water) as ordered by your caregiver. Slowly move to a bland diet, as tolerated, if the pain is related to the stomach or intestine.  Have a thermometer and take your temperature several times a day, and record it.  Bed rest and sleep, if it helps the pain.  Avoid sexual intercourse, if it causes pain.  Avoid stressful situations.  Keep your follow-up appointments and tests, as your caregiver orders.  If the pain does not go away with medicine or surgery, you may try:  Acupuncture.  Relaxation exercises (yoga, meditation).  Group therapy.  Counseling. SEEK MEDICAL CARE IF:   You notice certain foods cause stomach pain.  Your home care treatment is not helping your pain.  You need stronger pain medicine.  You want your IUD removed.  You feel faint or lightheaded.  You develop nausea and vomiting.  You develop a rash.  You are having side effects or an allergy to your medicine. SEEK IMMEDIATE MEDICAL CARE IF:   Your pain does not go away or gets worse.  You have a fever.  Your pain is felt only in portions of the abdomen. The right side could possibly be appendicitis. The left lower portion of the abdomen could be colitis or diverticulitis.  You are passing blood in your stools (bright red or black tarry stools, with or without vomiting).  You have blood in your urine.  You develop chills, with or without a fever.  You pass out. MAKE SURE YOU:   Understand these instructions.  Will watch your condition.  Will get help right away if you are not doing well or get worse. Document Released: 10/12/2007 Document Revised: 03/08/2012 Document Reviewed: 11/01/2009 [facility] Patient Information 2014 [place], [place].

## 2014-03-22 NOTE — Telephone Encounter (Signed)
Pt states she was seen in the ER for abdominal pain and nausea. Pt has called [facility] to try and get an appt but Dr. Maisie Fushomas is not available. Scheduled pt to see Doug SouJessica Zehr PA Friday at 8:30am. Discussed with pt that if she gets an appt with [facility] and does not need to keep the appt with us on Friday to call and cancel the appt with us. Pt verbalized understanding.

## 2014-03-22 NOTE — Telephone Encounter (Signed)
Pt called. [facility], thought it was her appendix but was told it was an colon/intestine problem and said she needed to see Dr. Maisie Fushomas by the end of this week.  I advised that Dr. Maisie Fushomas wouldn't be available after today and she wants to know who Dr. Maisie Fushomas recommends that she can see.Marland Kitchen. Please advise.Phyllis Garza.Phyllis Garza

## 2014-03-23 ENCOUNTER — Encounter: Payer: Self-pay | Admitting: *Deleted

## 2014-03-23 NOTE — ED Provider Notes (Signed)
Medical screening examination/treatment/procedure(s) were conducted as a shared visit with non-physician practitioner(s) or resident  and myself.  I personally evaluated the patient during the encounter and agree with the findings and plan unless otherwise indicated.    I have personally reviewed any xrays and/ or EKG's with the provider and I agree with interpretation.   Worsening RLQ pain different than her chronic LLQ abdominal pain, gradual onset. Exam mild RLQ pain, soft/ no guarding, no distress, mmm.  Plan for CT to rule out appendicitis. Follow up discussed. [place] Transvaginal Non-ob  03/22/2014   CLINICAL DATA:  Pelvic pain.  Partial hysterectomy.  EXAM: TRANSABDOMINAL AND TRANSVAGINAL ULTRASOUND OF PELVIS  TECHNIQUE: Both transabdominal and transvaginal ultrasound examinations of the pelvis were performed. Transabdominal technique was performed for global imaging of the pelvis including uterus, ovaries, adnexal regions, and pelvic cul-de-sac. It was necessary to proceed with endovaginal exam following the transabdominal exam to visualize the ovaries.  COMPARISON:  CT ABD/PELVIS W CM dated 03/21/2014  FINDINGS: The uterus is absent, consistent with history of hysterectomy. The ovaries were not visualized either transabdominally or transvaginally. No pelvic mass was identified. No free fluid was seen.  IMPRESSION: Prior hysterectomy.  Ovaries not identified.   Electronically Signed   By: Sebastian AcheAllen  Grady   On: 03/22/2014 00:20   [place] Pelvis Complete  03/22/2014   CLINICAL DATA:  Pelvic pain.  Partial hysterectomy.  EXAM: TRANSABDOMINAL AND TRANSVAGINAL ULTRASOUND OF PELVIS  TECHNIQUE: Both transabdominal and transvaginal ultrasound examinations of the pelvis were performed. Transabdominal technique was performed for global imaging of the pelvis including uterus, ovaries, adnexal regions, and pelvic cul-de-sac. It was necessary to proceed with endovaginal exam following the transabdominal exam to visualize the  ovaries.  COMPARISON:  CT ABD/PELVIS W CM dated 03/21/2014  FINDINGS: The uterus is absent, consistent with history of hysterectomy. The ovaries were not visualized either transabdominally or transvaginally. No pelvic mass was identified. No free fluid was seen.  IMPRESSION: Prior hysterectomy.  Ovaries not identified.   Electronically Signed   By: Sebastian AcheAllen  Grady   On: 03/22/2014 00:20   Ct Abdomen Pelvis W Contrast  03/21/2014   CLINICAL DATA:  Right lower quadrant pain.  Appendicitis.  EXAM: CT ABDOMEN AND PELVIS WITH CONTRAST  TECHNIQUE: Multidetector CT imaging of the abdomen and pelvis was performed using the standard protocol following bolus administration of intravenous contrast.  CONTRAST:  80mL OMNIPAQUE IOHEXOL 300 MG/ML  SOLN  COMPARISON:  None.  FINDINGS: Lung Bases: Normal.  Liver:  Normal.  Spleen:  Normal.  Gallbladder:  Partially contracted.  No calcified stones.  Common bile duct:  Normal.  Pancreas:  Normal.  Adrenal glands:  Normal.  Kidneys: Normal enhancement. Normal delayed excretion of contrast from the kidneys. Both ureters are within normal limits.  Stomach:  Partially collapsed.  Small bowel: No inflammatory changes or obstruction. No mesenteric adenopathy.  Colon: Normal appendix (image 68 series 2). No inflammatory changes of colon. Distal colon appears within normal limits.  Pelvic Genitourinary: Hysterectomy. Urinary bladder appears normal. No free fluid. No adenopathy.  Bones:  No aggressive osseous lesions.  Vasculature: Atherosclerosis.  No acute vascular abnormality.  Body Wall: Normal.  IMPRESSION: 1. No acute abnormality. 2. Normal appendix. 3. Hysterectomy.   Electronically Signed   By: Andreas NewportGeoffrey  Lamke M.D.   On: 03/21/2014 22:10   Abdominal pain  Enid SkeensJoshua M Jazzmon Prindle, MD 03/23/14 1240

## 2014-03-23 NOTE — Telephone Encounter (Signed)
Spoke with  Dr. Michaell CowingGross concerning patient , Dr. Michaell CowingGross reviewed Ct informed me to advise patient to keep her appointment with GI , Her CT did not indicate obstruction or hernia but she did need to see a GI physician to start a diet  Regimen for her constipation . Advised patient , patient will call after her ov with GI

## 2014-03-24 ENCOUNTER — Encounter: Payer: Self-pay | Admitting: Gastroenterology

## 2014-03-24 ENCOUNTER — Ambulatory Visit (INDEPENDENT_AMBULATORY_CARE_PROVIDER_SITE_OTHER): Payer: 59 | Admitting: Gastroenterology

## 2014-03-24 VITALS — BP 94/62 | HR 96 | Ht 64.57 in | Wt 115.0 lb

## 2014-03-24 DIAGNOSIS — K5900 Constipation, unspecified: Secondary | ICD-10-CM

## 2014-03-24 DIAGNOSIS — R109 Unspecified abdominal pain: Secondary | ICD-10-CM

## 2014-03-24 NOTE — Patient Instructions (Addendum)
You have been given a separate informational sheet regarding your tobacco use, the importance of quitting and local resources to help you quit.  You have a follow up visit with Dr. Arlyce DiceKaplan on 05-01-2014 at 945 am  You can purchase VSL # 3 probiotic over the counter and take once daily Have to go to the pharmacy to ask for it because they keep it in the refrigerator   Please increase your miralax to twice daily

## 2014-03-27 DIAGNOSIS — R109 Unspecified abdominal pain: Secondary | ICD-10-CM | POA: Insufficient documentation

## 2014-03-27 NOTE — Progress Notes (Signed)
03/27/2014 Scherrie NovemberKelly L Mcquiston [id number] 03/12/1966   History of Present Illness:  Patient is a 48 year old female who is known to Dr. Arlyce DiceKaplan for issues of severe constipation and has tried several bowel regimens over the years, including amitiza and linzess.  She is currently using only Miralax once daily because she saw Dr. Maisie Fushomas at [facility] for evaluation as well and she recommended to use only Miralax.  She says that she had a Sitz marker study performed, but I cannot find that in the system.  Colonoscopy 12/2010 revealed only two hyperplastic colonic polyps.  She is here today after being seen in the ED on 3/24 for complaints of abdominal pain.  She says that she went to the ED for right sided abdominal pain and it was suspected that she had appendicitis.  CT scan of the abdomen and pelvis on 3/24 was normal.  And pelvis ultrasounds were unremarkable as well.  CBC, CMP, and lipase were normal.  She says that she still has pain but it is somewhat better.  Has nausea as well (which also seems chronic).  Having small amounts of stool a couple of times per week.   Current Medications, Allergies, Past Medical History, Past Surgical History, Family History and Social History were reviewed in [facility] electronic medical record.   Physical Exam: BP 94/62  Pulse 96  Ht 5' 4.57" (1.64 m)  Wt 115 lb (52.164 kg)  BMI 19.39 kg/m2 General: Well developed white female in no acute distress Head: Normocephalic and atraumatic Eyes:  Sclerae anicteric, conjunctiva pink  Ears: Normal auditory acuity Lungs: Clear throughout to auscultation Heart: Regular rate and rhythm Abdomen: Soft, non-distended.  Normal bowel sounds.  Mild diffuse TTP without R/R/G. Musculoskeletal: Symmetrical with no gross deformities  Extremities: No edema  Neurological: Alert oriented x 4, grossly non-focal Psychological:  Alert and cooperative. Normal mood and affect  Assessment and Recommendations: -Life-long constipation:   Will increase Miralax to BID for now.  Tried Linzess, etc in the past but she says that Dr. Maisie Fushomas told her to stop taking it and just use Miralax. -Abdominal pain:  ? Related to the constipation vs gas.  Likely has IBS.  We will also have her re-start daily probiotic in the form of VSL #3.  Return OV with Dr. Arlyce DiceKaplan in 4-6 weeks.

## 2014-03-28 NOTE — Progress Notes (Signed)
Reviewed and agree with management.  Suspect abdominal pain is related to constipation.  Will refer to research for consideration of constipation trial Barbette HairRobert D. Arlyce DiceKaplan, M.D., [facility]  Nancy, please contact pt regarding IBD/constipation or CIC trial

## 2014-05-01 ENCOUNTER — Ambulatory Visit: Payer: 59 | Admitting: Gastroenterology

## 2015-12-10 ENCOUNTER — Telehealth: Payer: Self-pay | Admitting: Gastroenterology

## 2015-12-10 NOTE — Telephone Encounter (Signed)
Patient has chronic constipation. She takes Miralax daily and will titrate to what she needs. She feels she has this under control. Her present complaint is her abdomen is very distended and feel uncomfortable. Her bowel movements are unchanged.  Appointment made for evaluation.

## 2015-12-14 ENCOUNTER — Ambulatory Visit: Payer: 59 | Admitting: Physician Assistant

## 2016-01-24 ENCOUNTER — Ambulatory Visit: Payer: 59 | Admitting: Gastroenterology

## 2017-03-30 ENCOUNTER — Other Ambulatory Visit: Payer: Self-pay | Admitting: Physician Assistant

## 2017-03-30 DIAGNOSIS — Z1231 Encounter for screening mammogram for malignant neoplasm of breast: Secondary | ICD-10-CM

## 2017-04-07 ENCOUNTER — Ambulatory Visit
Admission: RE | Admit: 2017-04-07 | Discharge: 2017-04-07 | Disposition: A | Discharge status: Home / Self Care | Payer: BLUE CROSS/BLUE SHIELD | Source: Ambulatory Visit | Attending: Physician Assistant | Admitting: Physician Assistant

## 2017-04-07 DIAGNOSIS — Z1231 Encounter for screening mammogram for malignant neoplasm of breast: Secondary | ICD-10-CM

## 2017-04-08 ENCOUNTER — Other Ambulatory Visit: Payer: Self-pay | Admitting: Physician Assistant

## 2017-04-08 DIAGNOSIS — R928 Other abnormal and inconclusive findings on diagnostic imaging of breast: Secondary | ICD-10-CM

## 2017-04-10 ENCOUNTER — Ambulatory Visit
Admission: RE | Admit: 2017-04-10 | Discharge: 2017-04-10 | Disposition: A | Discharge status: Home / Self Care | Payer: BLUE CROSS/BLUE SHIELD | Source: Ambulatory Visit | Attending: Physician Assistant | Admitting: Physician Assistant

## 2017-04-10 ENCOUNTER — Other Ambulatory Visit: Payer: Self-pay | Admitting: Physician Assistant

## 2017-04-10 DIAGNOSIS — R921 Mammographic calcification found on diagnostic imaging of breast: Secondary | ICD-10-CM

## 2017-04-10 DIAGNOSIS — R928 Other abnormal and inconclusive findings on diagnostic imaging of breast: Secondary | ICD-10-CM

## 2017-04-13 ENCOUNTER — Ambulatory Visit
Admission: RE | Admit: 2017-04-13 | Discharge: 2017-04-13 | Disposition: A | Discharge status: Home / Self Care | Payer: BLUE CROSS/BLUE SHIELD | Source: Ambulatory Visit | Attending: Physician Assistant | Admitting: Physician Assistant

## 2017-04-13 DIAGNOSIS — R921 Mammographic calcification found on diagnostic imaging of breast: Secondary | ICD-10-CM

## 2017-04-20 ENCOUNTER — Encounter: Payer: Self-pay | Admitting: Registered"

## 2017-04-20 ENCOUNTER — Encounter: Payer: BLUE CROSS/BLUE SHIELD | Attending: Physician Assistant | Admitting: Registered"

## 2017-04-20 DIAGNOSIS — K5900 Constipation, unspecified: Secondary | ICD-10-CM

## 2017-04-20 DIAGNOSIS — K5909 Other constipation: Secondary | ICD-10-CM | POA: Diagnosis not present

## 2017-04-20 DIAGNOSIS — Z713 Dietary counseling and surveillance: Secondary | ICD-10-CM | POA: Diagnosis not present

## 2017-04-20 NOTE — Patient Instructions (Addendum)
Plan:  Consider trying Calm magnesium in the evenings  Consider having a regular walking routing  Consider drinking less water during meals  Low blood sugar is consider 70 or below, treat with fast acting carbohydrate such as OJ.  Consider adding a protein with your meals, eggs, nuts  Avoid skipping meals

## 2017-04-20 NOTE — Progress Notes (Signed)
  Medical Nutrition Therapy:  Appt start time: 0800 end time:  0900.   Assessment:  Primary concerns today: Pt states she has chronic constipation which affects her quality of life. Pt does not go out to eat because so many foods make her feel not well as well as experiencing early satiety. Pt reports issues started 4-5 years ago with >3 week constipation, prior to that BM 2x/week was her normal. Pt states she has had many tests but states her physicians are not clear what is causing the left side of her colon to move so slow. Pt states at the advice of one of her doctors she discontined bi-polar medications, but after 1 year has not noticed a difference. Pt reports she is going to start medications again to prevent potential episodes.  Pt also reported concern about her weight, and reports her heart doctor also was concerned (per referral pt weight is 162.4 lb, BMI 27.88). RD reviewed alternative outcome measures for health.  Preferred Learning Style:  No preference indicated  Learning Readiness:   Ready  MEDICATIONS: reviewed. Pt states filling prescriptions today for bi-polar medications.    DIETARY INTAKE:  Everyday foods include grilled chicken, Boost, fruits & veggies.  Avoided foods include red meat which cause nausea, spicy food, peas  24-hr recall:  B ( AM):  OJ and then later (9:30) may drink Boost  Snk ( AM): none OR strawberries, blackberries,   L ( PM): potato OR 1/2 sandwich, mustard, 1 slice [place], 1 slice cheese Snk ( PM): berries D (10 PM): Boost OR grilled chicken, vegetables Snk ( PM): none or f&v Beverages: water, if tired soda, OJ   Usual physical activity: ADL, 13-16,000 steps  Estimated energy needs: 1600 calories 180 g carbohydrates 120 g protein 44 g fat  Progress Towards Goal(s):  In progress.   Nutritional Diagnosis:  NI-1.4 Inadequate energy intake As related to small meals or skipped meals.  As evidenced by diet recall.    Intervention:   Nutrition Education. Reviewed normal body changes expected over time. Reviewed role of exercise in overall health. Discussed lifestyle changes that may improve constipation symptoms. Taught the rule of 15 for low blood sugar.  Plan:  Consider trying Calm magnesium in the evenings  Consider having a regular walking routine  Consider drinking less water during meals  Low blood sugar is consider 70 or below, treat with fast acting carbohydrate such as OJ.  Consider adding a protein with your meals, eggs, nuts  Avoid skipping meals  Teaching Method Utilized:  Visual Auditory  Handouts given during visit include:  none  Barriers to learning/adherence to lifestyle change: none  Demonstrated degree of understanding via:  Teach Back   Monitoring/Evaluation:  Dietary intake, exercise, energy, and body weight in 6 week(s).

## 2017-05-26 ENCOUNTER — Ambulatory Visit: Payer: BLUE CROSS/BLUE SHIELD | Admitting: Registered"

## 2018-03-03 ENCOUNTER — Other Ambulatory Visit: Payer: Self-pay | Admitting: Physician Assistant

## 2018-03-03 DIAGNOSIS — Z1231 Encounter for screening mammogram for malignant neoplasm of breast: Secondary | ICD-10-CM

## 2018-04-08 ENCOUNTER — Ambulatory Visit
Admission: RE | Admit: 2018-04-08 | Discharge: 2018-04-08 | Disposition: A | Discharge status: Home / Self Care | Payer: BLUE CROSS/BLUE SHIELD | Source: Ambulatory Visit | Attending: Physician Assistant | Admitting: Physician Assistant

## 2018-04-08 DIAGNOSIS — Z1231 Encounter for screening mammogram for malignant neoplasm of breast: Secondary | ICD-10-CM

## 2019-03-07 ENCOUNTER — Other Ambulatory Visit: Payer: Self-pay | Admitting: Physician Assistant

## 2019-03-07 DIAGNOSIS — Z1231 Encounter for screening mammogram for malignant neoplasm of breast: Secondary | ICD-10-CM

## 2019-04-14 ENCOUNTER — Ambulatory Visit: Payer: BLUE CROSS/BLUE SHIELD

## 2019-05-19 ENCOUNTER — Other Ambulatory Visit: Payer: Self-pay

## 2019-05-19 ENCOUNTER — Encounter (HOSPITAL_COMMUNITY): Payer: Self-pay

## 2019-05-19 ENCOUNTER — Emergency Department (HOSPITAL_COMMUNITY): Payer: BLUE CROSS/BLUE SHIELD

## 2019-05-19 ENCOUNTER — Emergency Department (HOSPITAL_COMMUNITY)
Admission: EM | Admit: 2019-05-19 | Discharge: 2019-05-19 | Disposition: A | Discharge status: Home / Self Care | Payer: BLUE CROSS/BLUE SHIELD | Attending: Emergency Medicine | Admitting: Emergency Medicine

## 2019-05-19 DIAGNOSIS — I10 Essential (primary) hypertension: Secondary | ICD-10-CM | POA: Diagnosis not present

## 2019-05-19 DIAGNOSIS — I252 Old myocardial infarction: Secondary | ICD-10-CM | POA: Insufficient documentation

## 2019-05-19 DIAGNOSIS — F17210 Nicotine dependence, cigarettes, uncomplicated: Secondary | ICD-10-CM | POA: Insufficient documentation

## 2019-05-19 DIAGNOSIS — Z79899 Other long term (current) drug therapy: Secondary | ICD-10-CM | POA: Diagnosis not present

## 2019-05-19 DIAGNOSIS — R1084 Generalized abdominal pain: Secondary | ICD-10-CM | POA: Diagnosis present

## 2019-05-19 LAB — CBC
HCT: 46.9 % — ABNORMAL HIGH (ref 36.0–46.0)
Hemoglobin: 15.4 g/dL — ABNORMAL HIGH (ref 12.0–15.0)
MCH: 29.4 pg (ref 26.0–34.0)
MCHC: 32.8 g/dL (ref 30.0–36.0)
MCV: 89.5 fL (ref 80.0–100.0)
Platelets: 270 10*3/uL (ref 150–400)
RBC: 5.24 MIL/uL — ABNORMAL HIGH (ref 3.87–5.11)
RDW: 12.6 % (ref 11.5–15.5)
WBC: 9.5 10*3/uL (ref 4.0–10.5)
nRBC: 0 % (ref 0.0–0.2)

## 2019-05-19 LAB — COMPREHENSIVE METABOLIC PANEL
ALT: 14 U/L (ref 0–44)
AST: 15 U/L (ref 15–41)
Albumin: 4.1 g/dL (ref 3.5–5.0)
Alkaline Phosphatase: 53 U/L (ref 38–126)
Anion gap: 9 (ref 5–15)
BUN: 8 mg/dL (ref 6–20)
CO2: 23 mmol/L (ref 22–32)
Calcium: 9 mg/dL (ref 8.9–10.3)
Chloride: 105 mmol/L (ref 98–111)
Creatinine, Ser: 0.54 mg/dL (ref 0.44–1.00)
GFR calc Af Amer: >60 mL/min (ref >60)
GFR calc non Af Amer: >60 mL/min (ref >60)
Glucose, Bld: 90 mg/dL (ref 70–99)
Potassium: 3.6 mmol/L (ref 3.5–5.1)
Sodium: 137 mmol/L (ref 135–145)
Total Bilirubin: 0.3 mg/dL (ref 0.3–1.2)
Total Protein: 7.1 g/dL (ref 6.5–8.1)

## 2019-05-19 LAB — MAGNESIUM: Magnesium: 2.1 mg/dL (ref 1.7–2.4)

## 2019-05-19 LAB — URINALYSIS, ROUTINE W REFLEX MICROSCOPIC
Bilirubin Urine: NEGATIVE
Glucose, UA: NEGATIVE mg/dL
Hgb urine dipstick: NEGATIVE
Ketones, ur: 5 mg/dL — AB
Leukocytes,Ua: NEGATIVE
Nitrite: NEGATIVE
Protein, ur: NEGATIVE mg/dL
Specific Gravity, Urine: 1.005 (ref 1.005–1.030)
pH: 6 (ref 5.0–8.0)

## 2019-05-19 LAB — LIPASE, BLOOD: Lipase: 21 U/L (ref 11–51)

## 2019-05-19 MED ORDER — SODIUM CHLORIDE 0.9% FLUSH
3.0000 mL | Freq: Once | INTRAVENOUS | Status: AC
  Administered 2019-05-19: 3 mL via INTRAVENOUS

## 2019-05-19 NOTE — Discharge Instructions (Addendum)
Follow-up with GI.  Use MiraLAX as described.

## 2019-05-19 NOTE — ED Triage Notes (Signed)
Patient c/o left mid abdominal pain. Patient states she has not had a BM in 2 weeks she last had a BM. 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 Patient states she was told to come to a hospital for further evaluation. Patient denies any N/V

## 2019-05-19 NOTE — ED Provider Notes (Signed)
[facility]-EMERGENCY DEPT Provider Note   CSN: [id number] Arrival date & time: 05/19/19  1650    History   Chief Complaint Chief Complaint  Patient presents with  . Abdominal Pain  . Constipation    HPI Phyllis Garza is a 53 y.o. female.     The history is provided by the patient.  Abdominal Pain  Pain location:  Generalized Pain quality: aching and bloating   Pain radiates to:  Does not radiate Pain severity:  Mild Onset quality:  Gradual Timing:  Intermittent Progression:  Waxing and waning Chronicity:  Recurrent Context: laxative use   Context comment:  Constipation Relieved by:  Nothing Worsened by:  Nothing Associated symptoms: constipation   Associated symptoms: no anorexia, no chest pain, no chills, no cough, no dysuria, no fatigue, no fever, no flatus, no hematuria, no melena, no nausea, no shortness of breath, no sore throat and no vomiting   Risk factors: has not had multiple surgeries     Past Medical History:  Diagnosis Date  . Anxiety   . Bipolar 2 disorder (HCC)   . Colon polyps 2012   hyperplastic  . Depression   . Hyperlipidemia   . Hypertension   . Myocardial infarction [facility])     Patient Active Problem List   Diagnosis Date Noted  . Abdominal pain, unspecified site 03/27/2014  . DEPRESSION 03/10/2011  . CONSTIPATION 03/10/2011  . BIPOLAR II DISORDER 01/09/2011  . ANXIETY 01/09/2011  . WEIGHT LOSS-ABNORMAL 01/09/2011  . ABDOMINAL PAIN-LUQ 01/09/2011    Past Surgical History:  Procedure Laterality Date  . ABDOMINAL HYSTERECTOMY     2010  . ankle pin     plate and 6 screws; right  . BREAST BIOPSY    . CARDIAC CATHETERIZATION    . CESAREAN SECTION     x2  . HERNIA REPAIR       OB History   No obstetric history on file.      Home Medications    Prior to Admission medications   Medication Sig Start Date End Date Taking? Authorizing Provider  citalopram (CELEXA) 10 MG tablet Take 10 mg by mouth  every morning.     [provider]  clonazePAM (KLONOPIN) 0.5 MG tablet Take 0.25-0.5 mg by mouth 3 (three) times daily as needed for anxiety. Anxiety or panic attack     [provider]  lamoTRIgine (LAMICTAL) 100 MG tablet Take 100 mg by mouth every morning.     [provider]  ondansetron (ZOFRAN) 4 MG tablet Take 1 tablet (4 mg total) by mouth every 6 (six) hours. Patient not taking: Reported on 04/20/2017 03/22/14   Sciacca, Ashok Cordia, PA-C  polyethylene glycol powder (GLYCOLAX/MIRALAX) powder Take 17 g by mouth daily.     [provider]    Family History Family History  Problem Relation Age of Onset  . Heart disease Mother        died age 64  . Asthma Mother   . Heart disease Father        died age 42  . Breast cancer Sister   . Stroke Maternal Grandmother   . Asthma Maternal Grandmother   . Colon polyps Brother        x 2  . Mental illness Paternal Grandmother   . Colon cancer Neg Hx     Social History Social History   Tobacco Use  . Smoking status: Current Some Day Smoker    Packs/day: 1.00  Types: Cigarettes  . Smokeless tobacco: Never Used  Substance Use Topics  . Alcohol use: No  . Drug use: No     Allergies   Azithromycin; Penicillins; Benadryl [diphenhydramine hcl]; Erythromycin base; Linzess [linaclotide]; Oxycodone-acetaminophen; Pseudoephedrine hcl er; and Codeine sulfate   Review of Systems Review of Systems  Constitutional: Negative for chills, fatigue and fever.  HENT: Negative for ear pain and sore throat.   Eyes: Negative for pain and visual disturbance.  Respiratory: Negative for cough and shortness of breath.   Cardiovascular: Negative for chest pain and palpitations.  Gastrointestinal: Positive for abdominal pain and constipation. Negative for anorexia, flatus, melena, nausea and vomiting.  Genitourinary: Negative for dysuria and hematuria.  Musculoskeletal: Negative for arthralgias and back pain.  Skin:  Negative for color change and rash.  Neurological: Negative for seizures and syncope.  All other systems reviewed and are negative.    Physical Exam Updated Vital Signs  ED Triage Vitals  Enc Vitals Group     BP 05/19/19 1659 (!) 150/92     Pulse Rate 05/19/19 1659 78     Resp 05/19/19 1659 18     Temp 05/19/19 1659 98.2 F (36.8 C)     Temp Source 05/19/19 1659 Oral     SpO2 05/19/19 1659 100 %     Weight 05/19/19 1702 174 lb (78.9 kg)     Height 05/19/19 1702 5\' 5"  (1.651 m)     Head Circumference --      Peak Flow --      Pain Score 05/19/19 1701 7     Pain Loc --      Pain Edu? --      Excl. in GC? --     Physical Exam Vitals signs and nursing note reviewed.  Constitutional:      General: She is not in acute distress.    Appearance: She is well-developed. She is not ill-appearing.  HENT:     Head: Normocephalic and atraumatic.     Mouth/Throat:     Mouth: Mucous membranes are moist.     Pharynx: Oropharynx is clear.  Eyes:     Extraocular Movements: Extraocular movements intact.     Conjunctiva/sclera: Conjunctivae normal.  Neck:     Musculoskeletal: Neck supple.  Cardiovascular:     Rate and Rhythm: Normal rate and regular rhythm.     Heart sounds: Normal heart sounds. No murmur.  Pulmonary:     Effort: Pulmonary effort is normal. No respiratory distress.     Breath sounds: Normal breath sounds.  Abdominal:     Palpations: Abdomen is soft.     Tenderness: There is no abdominal tenderness. There is no right CVA tenderness, left CVA tenderness, guarding or rebound. Negative signs include Murphy's sign and Rovsing's sign.  Skin:    General: Skin is warm and dry.     Capillary Refill: Capillary refill takes less than 2 seconds.  Neurological:     General: No focal deficit present.     Mental Status: She is alert.  Psychiatric:        Mood and Affect: Mood normal.      ED Treatments / Results  Labs (all labs ordered are listed, but only abnormal  results are displayed) Labs Reviewed  CBC - Abnormal; Notable for the following components:      Result Value   RBC 5.24 (*)    Hemoglobin 15.4 (*)    HCT 46.9 (*)    All other components  within normal limits  URINALYSIS, ROUTINE W REFLEX MICROSCOPIC - Abnormal; Notable for the following components:   Color, Urine STRAW (*)    Ketones, ur 5 (*)    All other components within normal limits  LIPASE, BLOOD  COMPREHENSIVE METABOLIC PANEL  MAGNESIUM    EKG None  Radiology Ct Abdomen Pelvis Wo Contrast  Result Date: 05/19/2019 CLINICAL DATA:  Abdominal pain EXAM: CT ABDOMEN AND PELVIS WITHOUT CONTRAST TECHNIQUE: Multidetector CT imaging of the abdomen and pelvis was performed following the standard protocol without IV contrast. COMPARISON:  03/21/2014 FINDINGS: LOWER CHEST: There is no basilar pleural or apical pericardial effusion. HEPATOBILIARY: The hepatic contours and density are normal. There is no intra- or extrahepatic biliary dilatation. The gallbladder is normal. PANCREAS: The pancreatic parenchymal contours are normal and there is no ductal dilatation. There is no peripancreatic fluid collection. SPLEEN: Normal. ADRENALS/URINARY TRACT: --Adrenal glands: Normal. --Right kidney/ureter: No hydronephrosis, nephroureterolithiasis, perinephric stranding or solid renal mass. --Left kidney/ureter: No hydronephrosis, nephroureterolithiasis, perinephric stranding or solid renal mass. --Urinary bladder: Normal for degree of distention STOMACH/BOWEL: --Stomach/Duodenum: There is no hiatal hernia or other gastric abnormality. The duodenal course and caliber are normal. --Small bowel: No dilatation or inflammation. --Colon: No focal abnormality. --Appendix: Normal. VASCULAR/LYMPHATIC: There is aortic atherosclerosis without hemodynamically significant stenosis. No abdominal or pelvic lymphadenopathy. REPRODUCTIVE: Status post hysterectomy. No adnexal mass. MUSCULOSKELETAL. No bony spinal canal stenosis  or focal osseous abnormality. OTHER: None. IMPRESSION: No acute abnormality of the abdomen or pelvis. Electronically Signed   By: Deatra Robinson M.D.   On: 05/19/2019 18:43    Procedures Procedures (including critical care time)  Medications Ordered in ED Medications  sodium chloride flush (NS) 0.9 % injection 3 mL (3 mLs Intravenous Given 05/19/19 1750)     Initial Impression / Assessment and Plan / ED Course  I have reviewed the triage vital signs and the nursing notes.  Pertinent labs & imaging results that were available during my care of the patient were reviewed by me and considered in my medical decision making (see chart for details).     Phyllis Garza is a 53 year old female with history of constipation, bipolar disorder who presents to the ED with abdominal pain, constipation.  Patient normal vitals.  No fever.  Patient was sent from primary care doctor for CT scan to rule out obstruction.  She has not had a normal bowel movement in 2 weeks.  She typically has a bowel movement twice a week.  She has been increasingly using enemas but no other medications.  She does not appear dehydrated on exam.  We will get basic lab work and a CT scan to evaluate for obstruction.  She denies any nausea, vomiting.  She is passing gas.  Will check electrolytes.  May need potassium/magnesium.  Educated her about the use of MiraLAX and to discontinue enemas.  Will reevaluate after lab work and imaging.  No significant anemia, electrolyte abnormality, kidney injury.  No leukocytosis.  CT scan of the abdomen and pelvis did not show obstruction or any other acute pathology.  Patient was given reassurance.  Educated her about MiraLAX and discharged from the ED in good condition.  Recommend that she follow-up with her GI doctor.   This chart was dictated using voice recognition software.  Despite best efforts to proofread,  errors can occur which can change the documentation meaning.    Final Clinical  Impressions(s) / ED Diagnoses   Final diagnoses:  Generalized abdominal pain  ED Discharge Orders    None       Virgina Norfolk, DO 05/19/19 1850

## 2019-06-02 ENCOUNTER — Other Ambulatory Visit: Payer: Self-pay

## 2019-06-02 ENCOUNTER — Ambulatory Visit: Payer: BLUE CROSS/BLUE SHIELD

## 2019-09-16 IMAGING — CT CT ABDOMEN AND PELVIS WITHOUT CONTRAST
2 of 4 series · 16 of 46 positions shown, 18 images · non-contrast
Comparison: 03/21/2014

CLINICAL DATA: Abdominal pain

EXAM:
CT ABDOMEN AND PELVIS WITHOUT CONTRAST
TECHNIQUE: Multidetector CT imaging of the abdomen and pelvis was performed
following the standard protocol without IV contrast.

[Series 2: axial st · axial · 0.83mm/px · z∈[-407,+3]mm · 13 of 92 slices shown, 15 images]
[im 5/92  soft-tissue]
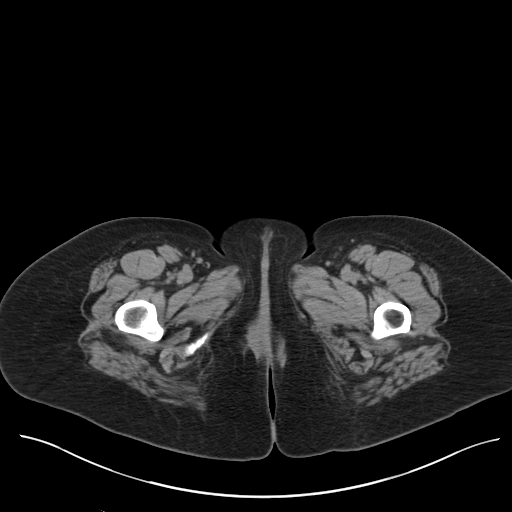
[im 5/92  bone]
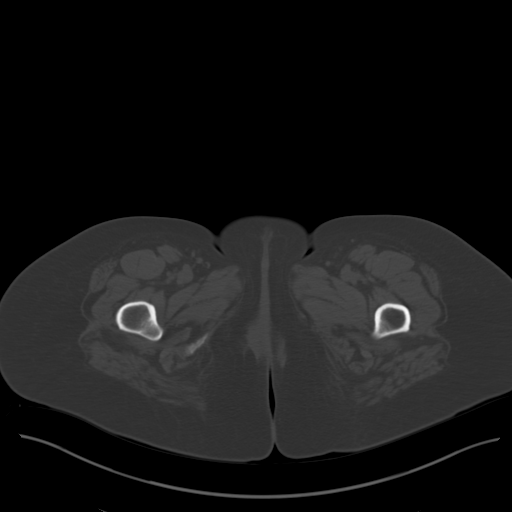
[im 15/92  soft-tissue]
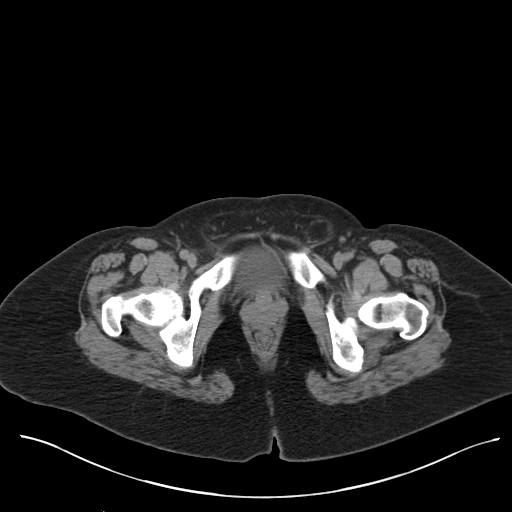
[im 20/92  soft-tissue]
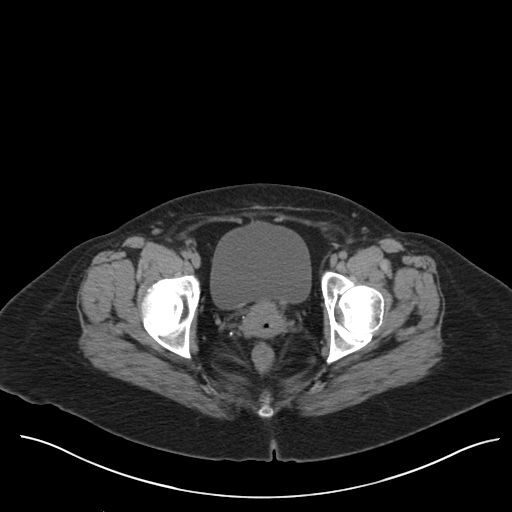
[im 24/92  soft-tissue]
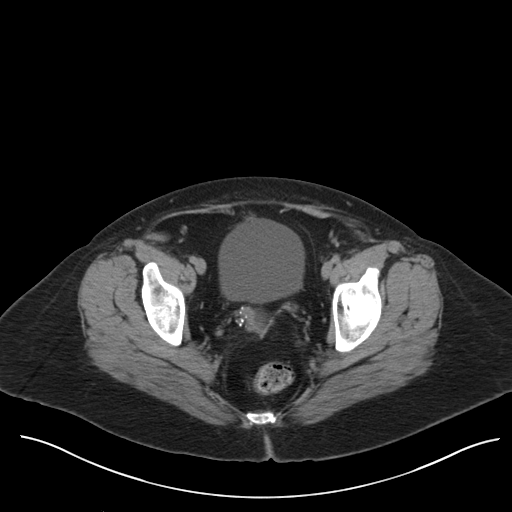
[im 34/92  soft-tissue]
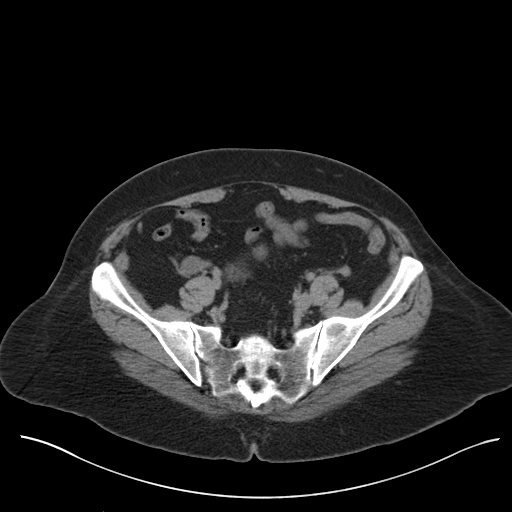
[im 39/92  soft-tissue]
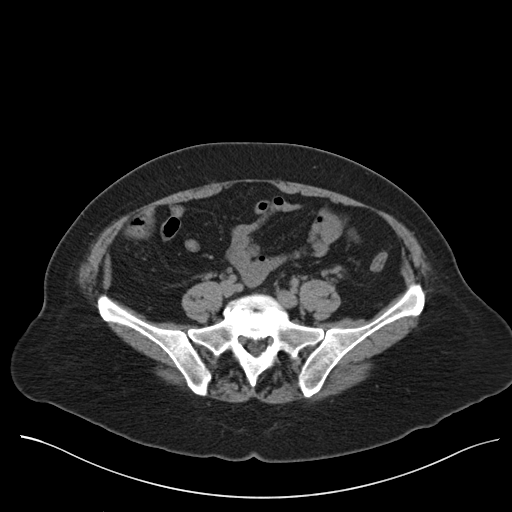
[im 48/92  soft-tissue]
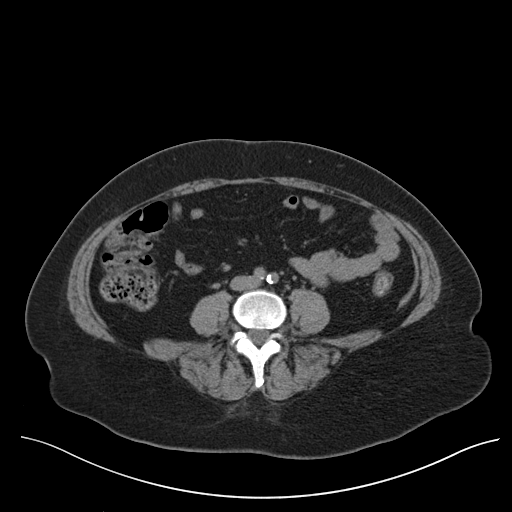
[im 53/92  soft-tissue]
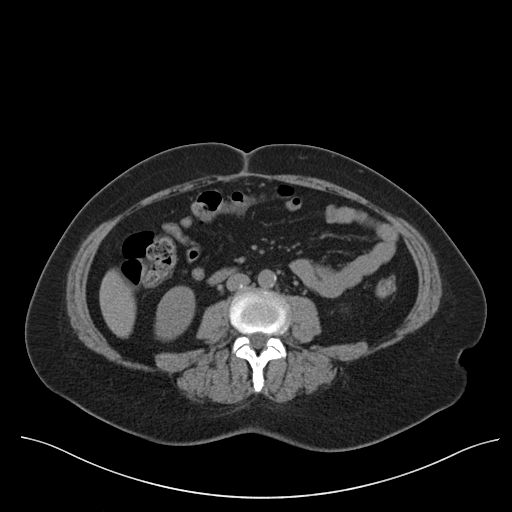
[im 58/92  soft-tissue]
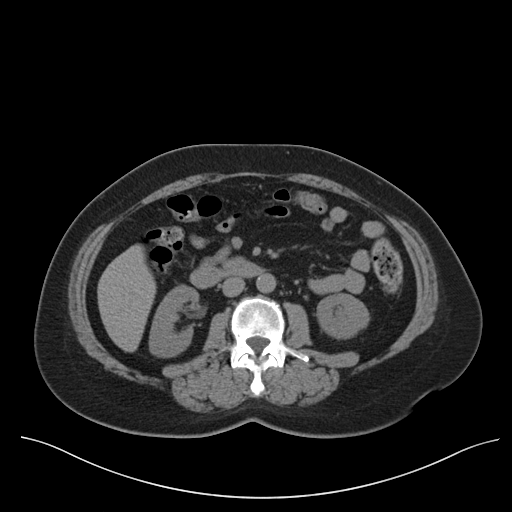
[im 58/92  bone]
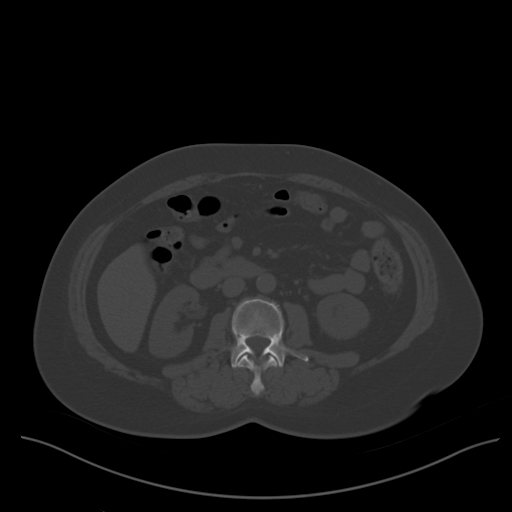
[im 68/92  soft-tissue]
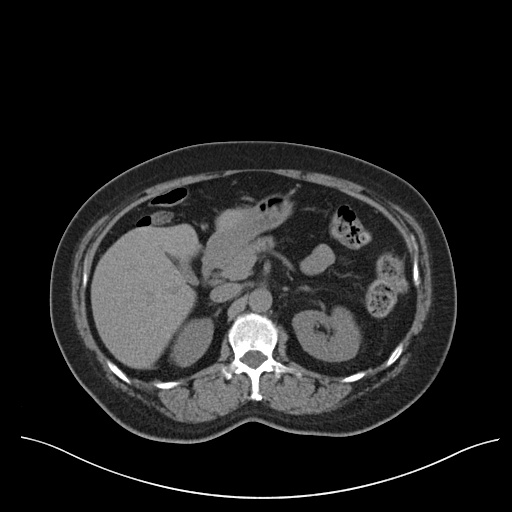
[im 72/92  soft-tissue]
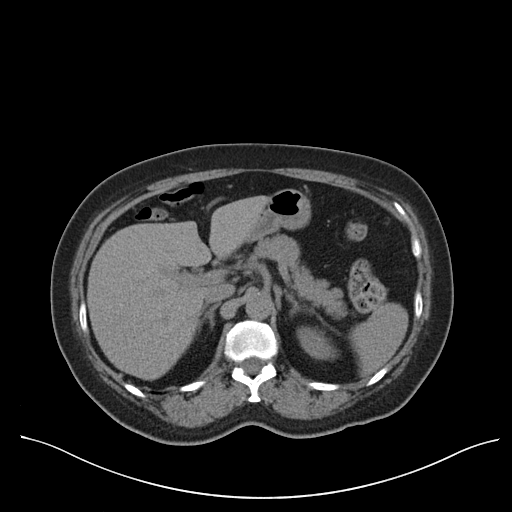
[im 77/92  soft-tissue]
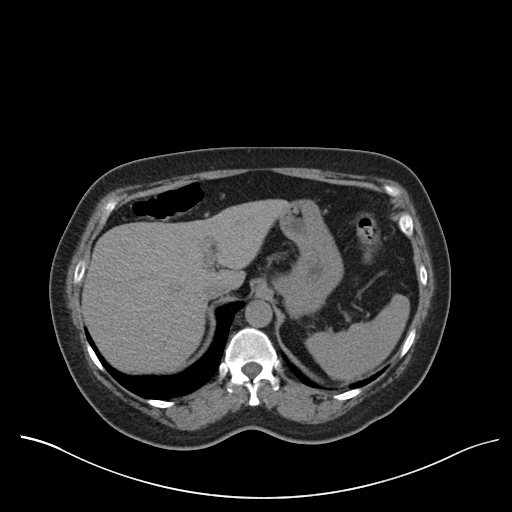
[im 87/92  soft-tissue]
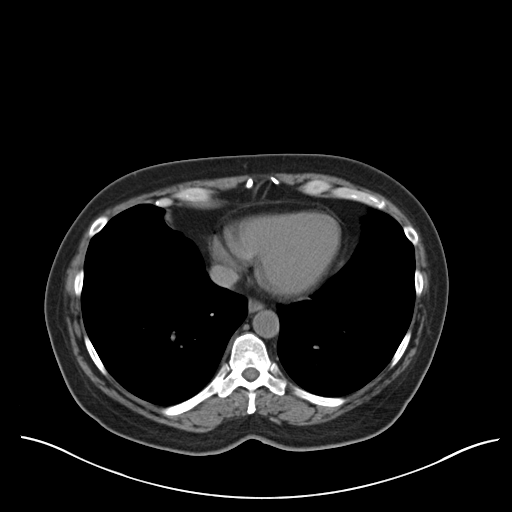

[Series 5: coronal st · coronal · 0.74mm/px · 3 of 137 slices shown]
[im 46/137  soft-tissue]
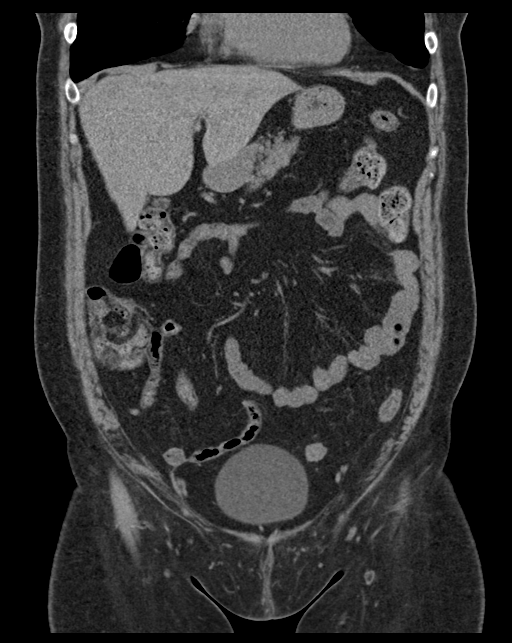
[im 61/137  soft-tissue]
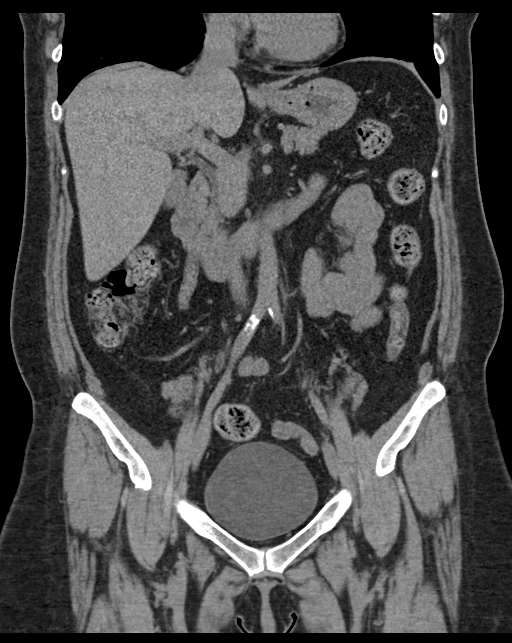
[im 76/137  soft-tissue]
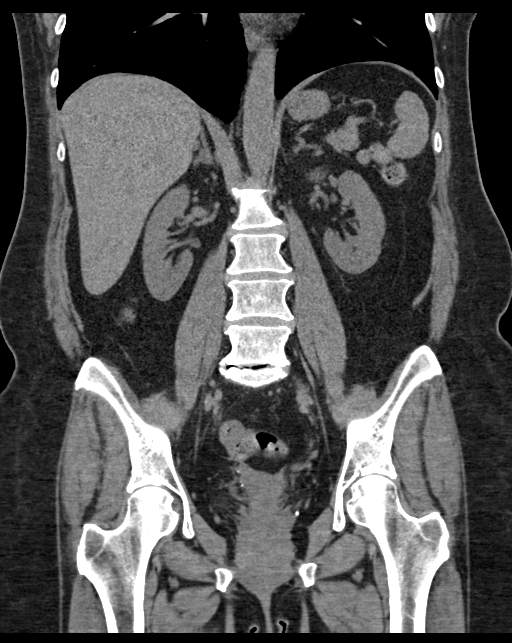

[16 of 46 positions shown; findings below may reference images not displayed]

FINDINGS: LOWER CHEST: There is no basilar pleural or apical pericardial
effusion.

HEPATOBILIARY: The hepatic contours and density are normal. There is
no intra- or extrahepatic biliary dilatation. The gallbladder is
normal.

PANCREAS: The pancreatic parenchymal contours are normal and there
is no ductal dilatation. There is no peripancreatic fluid
collection.

SPLEEN: Normal.

ADRENALS/URINARY TRACT:

--Adrenal glands: Normal.

--Right kidney/ureter: No hydronephrosis, nephroureterolithiasis,
perinephric stranding or solid renal mass.

--Left kidney/ureter: No hydronephrosis, nephroureterolithiasis,
perinephric stranding or solid renal mass.

--Urinary bladder: Normal for degree of distention

STOMACH/BOWEL:

--Stomach/Duodenum: There is no hiatal hernia or other gastric
abnormality. The duodenal course and caliber are normal.

--Small bowel: No dilatation or inflammation.

--Colon: No focal abnormality.

--Appendix: Normal.

VASCULAR/LYMPHATIC: There is aortic atherosclerosis without
hemodynamically significant stenosis. No abdominal or pelvic
lymphadenopathy.

REPRODUCTIVE: Status post hysterectomy. No adnexal mass.

MUSCULOSKELETAL. No bony spinal canal stenosis or focal osseous
abnormality.

OTHER: None.
IMPRESSION: No acute abnormality of the abdomen or pelvis.

## 2020-05-16 ENCOUNTER — Emergency Department (HOSPITAL_COMMUNITY)
Admission: EM | Admit: 2020-05-16 | Discharge: 2020-05-16 | Disposition: A | Discharge status: Home / Self Care | Payer: BLUE CROSS/BLUE SHIELD | Attending: Emergency Medicine | Admitting: Emergency Medicine

## 2020-05-16 ENCOUNTER — Encounter (HOSPITAL_COMMUNITY): Payer: Self-pay | Admitting: Emergency Medicine

## 2020-05-16 DIAGNOSIS — F17210 Nicotine dependence, cigarettes, uncomplicated: Secondary | ICD-10-CM | POA: Insufficient documentation

## 2020-05-16 DIAGNOSIS — I252 Old myocardial infarction: Secondary | ICD-10-CM | POA: Diagnosis not present

## 2020-05-16 DIAGNOSIS — M79601 Pain in right arm: Secondary | ICD-10-CM | POA: Insufficient documentation

## 2020-05-16 DIAGNOSIS — Z79899 Other long term (current) drug therapy: Secondary | ICD-10-CM | POA: Diagnosis not present

## 2020-05-16 DIAGNOSIS — I10 Essential (primary) hypertension: Secondary | ICD-10-CM | POA: Insufficient documentation

## 2020-05-16 DIAGNOSIS — T50Z95A Adverse effect of other vaccines and biological substances, initial encounter: Secondary | ICD-10-CM | POA: Diagnosis not present

## 2020-05-16 LAB — BASIC METABOLIC PANEL
Anion gap: 11 (ref 5–15)
BUN: 6 mg/dL (ref 6–20)
CO2: 20 mmol/L — ABNORMAL LOW (ref 22–32)
Calcium: 9.2 mg/dL (ref 8.9–10.3)
Chloride: 108 mmol/L (ref 98–111)
Creatinine, Ser: 0.64 mg/dL (ref 0.44–1.00)
GFR calc Af Amer: >60 mL/min (ref >60)
GFR calc non Af Amer: >60 mL/min (ref >60)
Glucose, Bld: 97 mg/dL (ref 70–99)
Potassium: 4.2 mmol/L (ref 3.5–5.1)
Sodium: 139 mmol/L (ref 135–145)

## 2020-05-16 LAB — CBC
HCT: 49.2 % — ABNORMAL HIGH (ref 36.0–46.0)
Hemoglobin: 16.5 g/dL — ABNORMAL HIGH (ref 12.0–15.0)
MCH: 29.8 pg (ref 26.0–34.0)
MCHC: 33.5 g/dL (ref 30.0–36.0)
MCV: 89 fL (ref 80.0–100.0)
Platelets: 269 10*3/uL (ref 150–400)
RBC: 5.53 MIL/uL — ABNORMAL HIGH (ref 3.87–5.11)
RDW: 12.6 % (ref 11.5–15.5)
WBC: 8.6 10*3/uL (ref 4.0–10.5)
nRBC: 0 % (ref 0.0–0.2)

## 2020-05-16 MED ORDER — IBUPROFEN 800 MG PO TABS
800.0000 mg | ORAL_TABLET | Freq: Once | ORAL | Status: AC
  Administered 2020-05-16: 800 mg via ORAL
  Filled 2020-05-16: qty 2

## 2020-05-16 NOTE — ED Triage Notes (Signed)
Pt  Herewith c/o arm pain and tingling from the covid vaccine , pt states that her arm is sore and hurts to move, pt took some tylenol yesterday but nothing this morning

## 2020-05-16 NOTE — Discharge Instructions (Signed)
You have been evaluated for your symptoms.  Symptoms are likely a potential side effect of the recent Covid vaccination.  Return if you develop tongue swelling, trouble breathing, abdominal cramping, or wheezing.

## 2020-05-16 NOTE — ED Provider Notes (Signed)
[facility] EMERGENCY DEPARTMENT Provider Note   CSN: [id number] Arrival date & time: 05/16/20  3500     History No chief complaint on file.   Phyllis Garza is a 54 y.o. female.  The history is provided by the patient. No language interpreter was used.     54 year old female with history of bipolar, anxiety, depression, hypertension, presenting with concern for potential reaction from her recent Covid vaccine.  Patient has multiple medication allergies.  Patient report 2 days ago she received the first shot of Covid vaccine from [place].  The next day she experienced fever, chills, body aches which lasted 4 days and resolved.  However last night she developed pain to her right arm with the injection site was.  She described pain as an achy tingling sensation that radiates towards the back of neck, her back, and down her right extremity including her right arm and right leg.  She also report tingling sensation and soreness down her leg.  She was having difficulty sleeping and was concerned.  She did not complain of any lightheadedness, dizziness, tongue swelling, wheezing, shortness of breath, abdominal cramping, rash.  While waiting in the ED, she received 800 mg of ibuprofen and states it did help with the symptom.  Past Medical History:  Diagnosis Date  . Anxiety   . Bipolar 2 disorder ([place])   . Colon polyps 2012   hyperplastic  . Depression   . Hyperlipidemia   . Hypertension   . Myocardial infarction [facility])     Patient Active Problem List   Diagnosis Date Noted  . Abdominal pain, unspecified site 03/27/2014  . DEPRESSION 03/10/2011  . CONSTIPATION 03/10/2011  . BIPOLAR II DISORDER 01/09/2011  . ANXIETY 01/09/2011  . WEIGHT LOSS-ABNORMAL 01/09/2011  . ABDOMINAL PAIN-LUQ 01/09/2011    Past Surgical History:  Procedure Laterality Date  . ABDOMINAL HYSTERECTOMY     2010  . ankle pin     plate and 6 screws; right  . BREAST BIOPSY    . CARDIAC  CATHETERIZATION    . CESAREAN SECTION     x2  . HERNIA REPAIR       OB History   No obstetric history on file.     Family History  Problem Relation Age of Onset  . Heart disease Mother        died age 53  . Asthma Mother   . Heart disease Father        died age 67  . Breast cancer Sister   . Stroke Maternal Grandmother   . Asthma Maternal Grandmother   . Colon polyps Brother        x 2  . Mental illness Paternal Grandmother   . Colon cancer Neg Hx     Social History   Tobacco Use  . Smoking status: Current Some Day Smoker    Packs/day: 1.00    Types: Cigarettes  . Smokeless tobacco: Never Used  Substance Use Topics  . Alcohol use: No  . Drug use: No    Home Medications Prior to Admission medications   Medication Sig Start Date End Date Taking? Authorizing Provider  citalopram (CELEXA) 10 MG tablet Take 10 mg by mouth every morning.     [provider]  clonazePAM (KLONOPIN) 0.5 MG tablet Take 0.25-0.5 mg by mouth 3 (three) times daily as needed for anxiety. Anxiety or panic attack     [provider]  lamoTRIgine (LAMICTAL) 100 MG tablet Take 100  mg by mouth every morning.     [provider]  ondansetron (ZOFRAN) 4 MG tablet Take 1 tablet (4 mg total) by mouth every 6 (six) hours. Patient not taking: Reported on 04/20/2017 03/22/14   Sciacca, Ashok Cordia, PA-C  polyethylene glycol powder (GLYCOLAX/MIRALAX) powder Take 17 g by mouth daily.     [provider]    Allergies    Azithromycin, Penicillins, Benadryl [diphenhydramine hcl], Erythromycin base, Linzess [linaclotide], Oxycodone-acetaminophen, Pseudoephedrine hcl er, and Codeine sulfate  Review of Systems   Review of Systems  All other systems reviewed and are negative.   Physical Exam Updated Vital Signs BP (!) 157/113 (BP Location: Left Arm)   Pulse 80   Temp 98.3 F (36.8 C) (Oral)   Resp 16   Ht 5\' 4"  (1.626 m)   Wt 73 kg   SpO2 98%   BMI 27.64 kg/m    Physical Exam Vitals and nursing note reviewed.  Constitutional:      General: She is not in acute distress.    Appearance: She is well-developed.     Comments: Appears anxious  HENT:     Head: Atraumatic.     Mouth/Throat:     Mouth: Mucous membranes are moist.     Comments: No tongue swelling, no mucosal edema. Eyes:     Conjunctiva/sclera: Conjunctivae normal.  Cardiovascular:     Rate and Rhythm: Normal rate and regular rhythm.     Pulses: Normal pulses.     Heart sounds: Normal heart sounds.  Pulmonary:     Effort: Pulmonary effort is normal.     Breath sounds: Normal breath sounds.  Abdominal:     General: Abdomen is flat.     Palpations: Abdomen is soft.     Tenderness: There is no abdominal tenderness.  Musculoskeletal:     Cervical back: Neck supple.  Skin:    Capillary Refill: Capillary refill takes less than 2 seconds.     Findings: No rash.     Comments: No localized skin reaction to right upper arm at the injection site.  Neurological:     Mental Status: She is alert and oriented to person, place, and time.     Comments: 5 out of 5 strength all 4 extremities with equal grip strength.  Sensation intact throughout.  Psychiatric:        Mood and Affect: Mood normal.     ED Results / Procedures / Treatments   Labs (all labs ordered are listed, but only abnormal results are displayed) Labs Reviewed  CBC - Abnormal; Notable for the following components:      Result Value   RBC 5.53 (*)    Hemoglobin 16.5 (*)    HCT 49.2 (*)    All other components within normal limits  BASIC METABOLIC PANEL - Abnormal; Notable for the following components:   CO2 20 (*)    All other components within normal limits    EKG None  Radiology No results found.  Procedures Procedures (including critical care time)  Medications Ordered in ED Medications  ibuprofen (ADVIL) tablet 800 mg (800 mg Oral Given by Other 05/16/20 05/18/20)    ED Course  I have reviewed the  triage vital signs and the nursing notes.  Pertinent labs & imaging results that were available during my care of the patient were reviewed by me and considered in my medical decision making (see chart for details).    MDM Rules/Calculators/A&P  BP (!) 157/113 (BP Location: Left Arm)   Pulse 80   Temp 98.3 F (36.8 C) (Oral)   Resp 16   Ht 5\' 4"  (1.626 m)   Wt 73 kg   SpO2 98%   BMI 27.64 kg/m   Final Clinical Impression(s) / ED Diagnoses Final diagnoses:  Vaccination reaction, initial encounter    Rx / DC Orders ED Discharge Orders    None     9:07 AM Patient complaining of tingling and pain at the injection site from a recent Covid vaccination 2 days ago.  She does not exhibit any symptoms concerning for anaphylaxis.  She did report some anticipated side effect of the injection.  At this time, she is stable for discharge.  Reassurance given.   , PA-C 05/16/20 05/18/20    1448, MD 05/16/20 [phone]

## 2020-05-18 ENCOUNTER — Other Ambulatory Visit: Payer: Self-pay

## 2020-05-18 ENCOUNTER — Emergency Department (HOSPITAL_COMMUNITY)
Admission: EM | Admit: 2020-05-18 | Discharge: 2020-05-18 | Disposition: A | Discharge status: Home / Self Care | Payer: BLUE CROSS/BLUE SHIELD | Attending: Emergency Medicine | Admitting: Emergency Medicine

## 2020-05-18 ENCOUNTER — Encounter (HOSPITAL_COMMUNITY): Payer: Self-pay

## 2020-05-18 DIAGNOSIS — M25511 Pain in right shoulder: Secondary | ICD-10-CM | POA: Diagnosis present

## 2020-05-18 DIAGNOSIS — F17210 Nicotine dependence, cigarettes, uncomplicated: Secondary | ICD-10-CM | POA: Diagnosis not present

## 2020-05-18 DIAGNOSIS — I10 Essential (primary) hypertension: Secondary | ICD-10-CM | POA: Insufficient documentation

## 2020-05-18 DIAGNOSIS — Z79899 Other long term (current) drug therapy: Secondary | ICD-10-CM | POA: Diagnosis not present

## 2020-05-18 DIAGNOSIS — M791 Myalgia, unspecified site: Secondary | ICD-10-CM | POA: Diagnosis not present

## 2020-05-18 MED ORDER — ONDANSETRON 8 MG PO TBDP: 8.0000 mg | ORAL_TABLET | Freq: Three times a day (TID) | ORAL | 0 refills | Status: AC | PRN

## 2020-05-18 MED ORDER — ONDANSETRON 8 MG PO TBDP
8.0000 mg | ORAL_TABLET | Freq: Once | ORAL | Status: AC
  Administered 2020-05-18: 8 mg via ORAL
  Filled 2020-05-18: qty 1

## 2020-05-18 MED ORDER — LORAZEPAM 2 MG/ML IJ SOLN: 2.0000 mg | Freq: Once | INTRAMUSCULAR | Status: DC

## 2020-05-18 MED ORDER — DIAZEPAM 2 MG PO TABS: 2.0000 mg | ORAL_TABLET | Freq: Four times a day (QID) | ORAL | 0 refills | Status: AC | PRN

## 2020-05-18 MED ORDER — LORAZEPAM 2 MG/ML IJ SOLN
1.0000 mg | Freq: Once | INTRAMUSCULAR | Status: AC
  Administered 2020-05-18: 1 mg via INTRAMUSCULAR
  Filled 2020-05-18: qty 1

## 2020-05-18 NOTE — ED Provider Notes (Signed)
[facility]-EMERGENCY DEPT Provider Note   CSN: [id number] Arrival date & time: 05/18/20  1751     History No chief complaint on file.   Phyllis Garza is a 54 y.o. female.  54 year old female who presents complaining of tenderness to a pinpoint area of her right scapula.  Pain is worse with any movement.  States that is made her unable to sleep.  Patient is seen her chiropractor as well who is done manipulations without relief as well.  Saw her doctor for similar symptoms and prescribed Flexeril and Naprosyn.  Denies any distal numbness or tingling to her hand.  Did receive a shot of Toradol today as well which did not seem to help.  She denies any shortness of breath or pleuritic pain.  No radiation down her arm.        Past Medical History:  Diagnosis Date  . Anxiety   . Bipolar 2 disorder ([facility])   . Colon polyps 2012   hyperplastic  . Depression   . Hyperlipidemia   . Hypertension   . Myocardial infarction [facility])     Patient Active Problem List   Diagnosis Date Noted  . Abdominal pain, unspecified site 03/27/2014  . DEPRESSION 03/10/2011  . CONSTIPATION 03/10/2011  . BIPOLAR II DISORDER 01/09/2011  . ANXIETY 01/09/2011  . WEIGHT LOSS-ABNORMAL 01/09/2011  . ABDOMINAL PAIN-LUQ 01/09/2011    Past Surgical History:  Procedure Laterality Date  . ABDOMINAL HYSTERECTOMY     2010  . ankle pin     plate and 6 screws; right  . BREAST BIOPSY    . CARDIAC CATHETERIZATION    . CESAREAN SECTION     x2  . HERNIA REPAIR       OB History   No obstetric history on file.     Family History  Problem Relation Age of Onset  . Heart disease Mother        died age 60  . Asthma Mother   . Heart disease Father        died age 35  . Breast cancer Sister   . Stroke Maternal Grandmother   . Asthma Maternal Grandmother   . Colon polyps Brother        x 2  . Mental illness Paternal Grandmother   . Colon cancer Neg Hx     Social History   Tobacco  Use  . Smoking status: Current Some Day Smoker    Packs/day: 1.00    Types: Cigarettes  . Smokeless tobacco: Never Used  Substance Use Topics  . Alcohol use: No  . Drug use: No    Home Medications Prior to Admission medications   Medication Sig Start Date End Date Taking? Authorizing Provider  citalopram (CELEXA) 10 MG tablet Take 10 mg by mouth every morning.     [provider]  clonazePAM (KLONOPIN) 0.5 MG tablet Take 0.25-0.5 mg by mouth 3 (three) times daily as needed for anxiety. Anxiety or panic attack     [provider]  lamoTRIgine (LAMICTAL) 100 MG tablet Take 100 mg by mouth every morning.     [provider]  ondansetron (ZOFRAN) 4 MG tablet Take 1 tablet (4 mg total) by mouth every 6 (six) hours. Patient not taking: Reported on 04/20/2017 03/22/14   Sciacca, Ashok Cordia, PA-C  polyethylene glycol powder (GLYCOLAX/MIRALAX) powder Take 17 g by mouth daily.     [provider]    Allergies    Azithromycin, Penicillins, Benadryl [diphenhydramine  hcl], Erythromycin base, Linzess [linaclotide], Oxycodone-acetaminophen, Pseudoephedrine hcl er, and Codeine sulfate  Review of Systems   Review of Systems  All other systems reviewed and are negative.   Physical Exam Updated Vital Signs BP (!) 142/106   Pulse 82   Temp 98.1 F (36.7 C)   Resp 18   Ht 1.524 m (5')   Wt 73 kg   SpO2 98%   BMI 31.44 kg/m   Physical Exam Vitals and nursing note reviewed.  Constitutional:      General: She is not in acute distress.    Appearance: Normal appearance. She is well-developed. She is not toxic-appearing.  HENT:     Head: Normocephalic and atraumatic.  Eyes:     General: Lids are normal.     Conjunctiva/sclera: Conjunctivae normal.     Pupils: Pupils are equal, round, and reactive to light.  Neck:     Thyroid: No thyroid mass.     Trachea: No tracheal deviation.  Cardiovascular:     Rate and Rhythm: Normal rate and regular rhythm.      Heart sounds: Normal heart sounds. No murmur. No gallop.   Pulmonary:     Effort: Pulmonary effort is normal. No respiratory distress.     Breath sounds: Normal breath sounds. No stridor. No decreased breath sounds, wheezing, rhonchi or rales.  Abdominal:     General: Bowel sounds are normal. There is no distension.     Palpations: Abdomen is soft.     Tenderness: There is no abdominal tenderness. There is no rebound.  Musculoskeletal:        General: Normal range of motion.     Cervical back: Normal range of motion and neck supple.     Thoracic back: Tenderness present.       Back:  Skin:    General: Skin is warm and dry.     Findings: No abrasion or rash.  Neurological:     Mental Status: She is alert and oriented to person, place, and time.     GCS: GCS eye subscore is 4. GCS verbal subscore is 5. GCS motor subscore is 6.     Cranial Nerves: No cranial nerve deficit.     Sensory: No sensory deficit.  Psychiatric:        Speech: Speech normal.        Behavior: Behavior normal.     ED Results / Procedures / Treatments   Labs (all labs ordered are listed, but only abnormal results are displayed) Labs Reviewed - No data to display  EKG None  Radiology No results found.  Procedures Procedures (including critical care time)  Medications Ordered in ED Medications  LORazepam (ATIVAN) injection 2 mg (has no administration in time range)  ondansetron (ZOFRAN-ODT) disintegrating tablet 8 mg (has no administration in time range)    ED Course  I have reviewed the triage vital signs and the nursing notes.  Pertinent labs & imaging results that were available during my care of the patient were reviewed by me and considered in my medical decision making (see chart for details).    MDM Rules/Calculators/A&P                      Patient given Ativan and Zofran here.  Feels better and is resting comfortably.  Suspect trigger point as she states that she had held her right arm  in a certain position for very long time.  Will prescribe Valium and Zofran and  discharge Final Clinical Impression(s) / ED Diagnoses Final diagnoses:  None    Rx / DC Orders ED Discharge Orders    None       Lorre Nick, MD 05/18/20 2143

## 2020-05-18 NOTE — ED Triage Notes (Addendum)
Patient c/o pain from necks to hips, and numbness and tingling of the right arm. Patient states, "It does not hurt at the injection site, but "deep down.". When I went to [facility] ED the other day, my arm was drawn up to my chest."  Patient states that she took a flexeril today and stopped at a fire station earlier today.

## 2021-07-22 ENCOUNTER — Encounter: Payer: Self-pay | Admitting: Gastroenterology

## 2022-02-04 ENCOUNTER — Emergency Department (HOSPITAL_BASED_OUTPATIENT_CLINIC_OR_DEPARTMENT_OTHER)
Admission: EM | Admit: 2022-02-04 | Discharge: 2022-02-04 | Disposition: A | Discharge status: Home / Self Care | Payer: 59 | Attending: Emergency Medicine | Admitting: Emergency Medicine

## 2022-02-04 ENCOUNTER — Other Ambulatory Visit: Payer: Self-pay

## 2022-02-04 ENCOUNTER — Encounter (HOSPITAL_BASED_OUTPATIENT_CLINIC_OR_DEPARTMENT_OTHER): Payer: Self-pay | Admitting: Pediatrics

## 2022-02-04 DIAGNOSIS — F419 Anxiety disorder, unspecified: Secondary | ICD-10-CM | POA: Diagnosis not present

## 2022-02-04 DIAGNOSIS — F410 Panic disorder [episodic paroxysmal anxiety] without agoraphobia: Secondary | ICD-10-CM | POA: Diagnosis not present

## 2022-02-04 DIAGNOSIS — R42 Dizziness and giddiness: Secondary | ICD-10-CM | POA: Diagnosis present

## 2022-02-04 LAB — URINALYSIS, ROUTINE W REFLEX MICROSCOPIC
Bilirubin Urine: NEGATIVE
Glucose, UA: NEGATIVE mg/dL
Hgb urine dipstick: NEGATIVE
Ketones, ur: NEGATIVE mg/dL
Leukocytes,Ua: NEGATIVE
Nitrite: NEGATIVE
Protein, ur: NEGATIVE mg/dL
Specific Gravity, Urine: 1.005 (ref 1.005–1.030)
pH: 7 (ref 5.0–8.0)

## 2022-02-04 LAB — CBG MONITORING, ED: Glucose-Capillary: 103 mg/dL — ABNORMAL HIGH (ref 70–99)

## 2022-02-04 LAB — CBC
HCT: 46.2 % — ABNORMAL HIGH (ref 36.0–46.0)
Hemoglobin: 15.1 g/dL — ABNORMAL HIGH (ref 12.0–15.0)
MCH: 28.7 pg (ref 26.0–34.0)
MCHC: 32.7 g/dL (ref 30.0–36.0)
MCV: 87.7 fL (ref 80.0–100.0)
Platelets: 234 10*3/uL (ref 150–400)
RBC: 5.27 MIL/uL — ABNORMAL HIGH (ref 3.87–5.11)
RDW: 12.7 % (ref 11.5–15.5)
WBC: 8.9 10*3/uL (ref 4.0–10.5)
nRBC: 0 % (ref 0.0–0.2)

## 2022-02-04 LAB — BASIC METABOLIC PANEL
Anion gap: 6 (ref 5–15)
BUN: 7 mg/dL (ref 6–20)
CO2: 27 mmol/L (ref 22–32)
Calcium: 9.5 mg/dL (ref 8.9–10.3)
Chloride: 107 mmol/L (ref 98–111)
Creatinine, Ser: 0.71 mg/dL (ref 0.44–1.00)
GFR, Estimated: >60 mL/min (ref >60)
Glucose, Bld: 99 mg/dL (ref 70–99)
Potassium: 3.8 mmol/L (ref 3.5–5.1)
Sodium: 140 mmol/L (ref 135–145)

## 2022-02-04 NOTE — ED Notes (Signed)
MD at Bedside.

## 2022-02-04 NOTE — Discharge Instructions (Signed)
Follow-up with your psychiatrist this week for medication adjustment

## 2022-02-04 NOTE — ED Notes (Signed)
Pt up to bathroom, given a UA cup

## 2022-02-04 NOTE — ED Notes (Signed)
Patient verbalizes understanding of discharge instructions. Opportunity for questioning and answers were provided. Patient discharged from ED.  °

## 2022-02-04 NOTE — ED Provider Notes (Signed)
[place] EMERGENCY DEPT Provider Note   CSN: [id number] Arrival date & time: 02/04/22  1324     History  Chief Complaint  Patient presents with   Dizziness    Phyllis Garza is a 56 y.o. female.  56 year old female complains of intermittent dizziness times several days.  States it last for a few seconds.  Does endorse a history of anxiety and panic attacks.  States that this does feel somewhat similar.  She denies any syncope.  No associated chest pain or shortness of breath.  Was on Klonopin in the past and recently started taking it again.  Called her psychiatrist and told to come here      Home Medications Prior to Admission medications   Medication Sig Start Date End Date Taking? Authorizing Provider  clonazePAM (KLONOPIN) 0.5 MG tablet Take 0.25-0.5 mg by mouth 3 (three) times daily as needed for anxiety. Anxiety or panic attack     [provider]  diazepam (VALIUM) 2 MG tablet Take 1 tablet (2 mg total) by mouth every 6 (six) hours as needed for muscle spasms. 05/18/20   Lacretia Leigh, MD  naproxen (NAPROSYN) 500 MG tablet Take 500 mg by mouth 2 (two) times daily. 05/18/20   [provider]  ondansetron (ZOFRAN ODT) 8 MG disintegrating tablet Take 1 tablet (8 mg total) by mouth every 8 (eight) hours as needed for nausea or vomiting. 05/18/20   Lacretia Leigh, MD  ondansetron (ZOFRAN) 4 MG tablet Take 1 tablet (4 mg total) by mouth every 6 (six) hours. Patient not taking: Reported on 04/20/2017 03/22/14   Sciacca, Mable Fill, PA-C  polyethylene glycol powder (GLYCOLAX/MIRALAX) powder Take 17 g by mouth daily as needed for moderate constipation.     [provider]      Allergies    Azithromycin, Penicillins, Benadryl [diphenhydramine hcl], Erythromycin base, Linzess [linaclotide], Oxycodone-acetaminophen, Pseudoephedrine hcl er, and Codeine sulfate    Review of Systems   Review of Systems  All other systems reviewed and are  negative.  Physical Exam Updated Vital Signs BP 116/79    Pulse 72    Temp 98.1 F (36.7 C)    Resp 16    Ht 1.651 m (5\' 5" )    Wt 75 kg    SpO2 93%    BMI 27.51 kg/m  Physical Exam Vitals and nursing note reviewed.  Constitutional:      General: She is not in acute distress.    Appearance: Normal appearance. She is well-developed. She is not toxic-appearing.  HENT:     Head: Normocephalic and atraumatic.  Eyes:     General: Lids are normal.     Conjunctiva/sclera: Conjunctivae normal.     Pupils: Pupils are equal, round, and reactive to light.  Neck:     Thyroid: No thyroid mass.     Trachea: No tracheal deviation.  Cardiovascular:     Rate and Rhythm: Normal rate and regular rhythm.     Heart sounds: Normal heart sounds. No murmur heard.   No gallop.  Pulmonary:     Effort: Pulmonary effort is normal. No respiratory distress.     Breath sounds: Normal breath sounds. No stridor. No decreased breath sounds, wheezing, rhonchi or rales.  Abdominal:     General: There is no distension.     Palpations: Abdomen is soft.     Tenderness: There is no abdominal tenderness. There is no rebound.  Musculoskeletal:        General: No tenderness.  Normal range of motion.     Cervical back: Normal range of motion and neck supple.  Skin:    General: Skin is warm and dry.     Findings: No abrasion or rash.  Neurological:     Mental Status: She is alert and oriented to person, place, and time. Mental status is at baseline.     GCS: GCS eye subscore is 4. GCS verbal subscore is 5. GCS motor subscore is 6.     Cranial Nerves: No cranial nerve deficit.     Sensory: No sensory deficit.     Motor: Motor function is intact.  Psychiatric:        Attention and Perception: Attention normal.        Speech: Speech normal.        Behavior: Behavior normal.    ED Results / Procedures / Treatments   Labs (all labs ordered are listed, but only abnormal results are displayed) Labs Reviewed  CBC -  Abnormal; Notable for the following components:      Result Value   RBC 5.27 (*)    Hemoglobin 15.1 (*)    HCT 46.2 (*)    All other components within normal limits  URINALYSIS, ROUTINE W REFLEX MICROSCOPIC - Abnormal; Notable for the following components:   Color, Urine COLORLESS (*)    All other components within normal limits  CBG MONITORING, ED - Abnormal; Notable for the following components:   Glucose-Capillary 103 (*)    All other components within normal limits  BASIC METABOLIC PANEL    EKG EKG Interpretation  Date/Time:  Tuesday February 04 2022 13:41:44 EST Ventricular Rate:  79 PR Interval:  146 QRS Duration: 78 QT Interval:  382 QTC Calculation: 438 R Axis:   62 Text Interpretation: Normal sinus rhythm Septal infarct , age undetermined Abnormal ECG Confirmed by Lacretia Leigh ([id number]) on 02/04/2022 3:13:08 PM  Radiology No results found.  Procedures Procedures    Medications Ordered in ED Medications - No data to display  ED Course/ Medical Decision Making/ A&P                           Medical Decision Making Amount and/or Complexity of Data Reviewed Labs: ordered.   Patient's EKG per my interpretation shows no sign of ischemic changes.  Patient CBG was normal here.  Labs are reassuring.  Patient's symptoms appear to be more consistent with panic attacks.  She does endorse periods of impending doom when these symptoms occur.  Low suspicion for TIAs or ACS.  Visit done in front of spouse who agrees that patient has been more stressed recently.  Recommend that patient follow with her psychiatrist        Final Clinical Impression(s) / ED Diagnoses Final diagnoses:  None    Rx / DC Orders ED Discharge Orders     None         Lacretia Leigh, MD 02/04/22 1533

## 2022-02-04 NOTE — ED Triage Notes (Signed)
C/O dizziness since Saturday, feelings of "everything going dark for a few seconds" followed by nausea afterwards, approx 10 episodes since then.
